# Patient Record
Sex: Female | Born: 1975 | Race: White | Hispanic: No | Marital: Married | Smoking: Former smoker
Health system: Southern US, Community
[De-identification: ages and names within clinical notes are randomized; demographics above are authoritative.]

## PROBLEM LIST (undated history)

## (undated) ENCOUNTER — Inpatient Hospital Stay (HOSPITAL_COMMUNITY): Payer: BC Managed Care – PPO

## (undated) DIAGNOSIS — T7840XA Allergy, unspecified, initial encounter: Secondary | ICD-10-CM

## (undated) DIAGNOSIS — G709 Myoneural disorder, unspecified: Secondary | ICD-10-CM

## (undated) DIAGNOSIS — E119 Type 2 diabetes mellitus without complications: Secondary | ICD-10-CM

## (undated) DIAGNOSIS — O0990 Supervision of high risk pregnancy, unspecified, unspecified trimester: Secondary | ICD-10-CM

## (undated) DIAGNOSIS — D693 Immune thrombocytopenic purpura: Principal | ICD-10-CM

## (undated) HISTORY — PX: WISDOM TOOTH EXTRACTION: SHX21

## (undated) HISTORY — DX: Supervision of high risk pregnancy, unspecified, unspecified trimester: O09.90

## (undated) HISTORY — DX: Allergy, unspecified, initial encounter: T78.40XA

## (undated) HISTORY — DX: Immune thrombocytopenic purpura: D69.3

## (undated) HISTORY — DX: Type 2 diabetes mellitus without complications: E11.9

---

## 2005-10-08 ENCOUNTER — Encounter: Admission: RE | Admit: 2005-10-08 | Discharge: 2005-10-08 | Payer: Self-pay | Admitting: *Deleted

## 2005-12-24 ENCOUNTER — Inpatient Hospital Stay (HOSPITAL_COMMUNITY): Admission: RE | Admit: 2005-12-24 | Discharge: 2005-12-26 | Payer: Self-pay | Admitting: *Deleted

## 2008-11-17 ENCOUNTER — Encounter: Admission: RE | Admit: 2008-11-17 | Discharge: 2008-11-17 | Payer: Self-pay | Admitting: Otolaryngology

## 2009-05-19 ENCOUNTER — Ambulatory Visit: Payer: Self-pay | Admitting: Oncology

## 2009-05-25 LAB — FOLATE: Folate: >20 ng/mL

## 2009-05-25 LAB — CBC WITH DIFFERENTIAL/PLATELET
BASO%: 0.3 % (ref 0.0–2.0)
Basophils Absolute: 0 10*3/uL (ref 0.0–0.1)
HGB: 14.5 g/dL (ref 11.6–15.9)
MCH: 33.3 pg (ref 25.1–34.0)
MCV: 95.8 fL (ref 79.5–101.0)
NEUT#: 3.4 10*3/uL (ref 1.5–6.5)
Platelets: 130 10*3/uL — ABNORMAL LOW (ref 145–400)
RBC: 4.34 10*6/uL (ref 3.70–5.45)

## 2009-05-25 LAB — MORPHOLOGY: PLT EST: DECREASED

## 2009-05-25 LAB — CHCC SMEAR

## 2009-05-25 LAB — VITAMIN B12: Vitamin B-12: 323 pg/mL (ref 211–911)

## 2009-07-24 ENCOUNTER — Encounter: Admission: RE | Admit: 2009-07-24 | Discharge: 2009-07-24 | Payer: Self-pay | Admitting: Family Medicine

## 2009-11-21 ENCOUNTER — Ambulatory Visit: Payer: Self-pay | Admitting: Oncology

## 2009-11-23 LAB — MORPHOLOGY: PLT EST: DECREASED

## 2009-11-23 LAB — CBC WITH DIFFERENTIAL/PLATELET
BASO%: 0.4 % (ref 0.0–2.0)
EOS%: 1.9 % (ref 0.0–7.0)
Eosinophils Absolute: 0.1 10*3/uL (ref 0.0–0.5)
HCT: 37.9 % (ref 34.8–46.6)
MCH: 33.3 pg (ref 25.1–34.0)
MCV: 94.9 fL (ref 79.5–101.0)
MONO%: 9.7 % (ref 0.0–14.0)
Platelets: 134 10*3/uL — ABNORMAL LOW (ref 145–400)
RDW: 12.6 % (ref 11.2–14.5)
WBC: 5.9 10*3/uL (ref 3.9–10.3)
lymph#: 2.1 10*3/uL (ref 0.9–3.3)

## 2009-11-23 LAB — CHCC SMEAR

## 2010-05-18 ENCOUNTER — Ambulatory Visit: Payer: Self-pay | Admitting: Oncology

## 2010-05-22 LAB — CBC WITH DIFFERENTIAL/PLATELET
BASO%: 0.7 % (ref 0.0–2.0)
Basophils Absolute: 0 10e3/uL (ref 0.0–0.1)
EOS%: 1.3 % (ref 0.0–7.0)
Eosinophils Absolute: 0.1 10e3/uL (ref 0.0–0.5)
HCT: 39.9 % (ref 34.8–46.6)
HGB: 13.7 g/dL (ref 11.6–15.9)
LYMPH%: 27.4 % (ref 14.0–49.7)
MCH: 32.7 pg (ref 25.1–34.0)
MCHC: 34.3 g/dL (ref 31.5–36.0)
MCV: 95.2 fL (ref 79.5–101.0)
MONO#: 0.6 10e3/uL (ref 0.1–0.9)
MONO%: 9.3 % (ref 0.0–14.0)
NEUT#: 3.7 10e3/uL (ref 1.5–6.5)
NEUT%: 61.3 % (ref 38.4–76.8)
Platelets: 128 10e3/uL — ABNORMAL LOW (ref 145–400)
RBC: 4.19 10e6/uL (ref 3.70–5.45)
RDW: 12.7 % (ref 11.2–14.5)
WBC: 6 10e3/uL (ref 3.9–10.3)
lymph#: 1.7 10e3/uL (ref 0.9–3.3)

## 2010-05-22 LAB — COMPREHENSIVE METABOLIC PANEL WITH GFR
ALT: 17 U/L (ref 0–35)
AST: 16 U/L (ref 0–37)
Albumin: 4.6 g/dL (ref 3.5–5.2)
Alkaline Phosphatase: 80 U/L (ref 39–117)
BUN: 9 mg/dL (ref 6–23)
CO2: 24 meq/L (ref 19–32)
Calcium: 8.8 mg/dL (ref 8.4–10.5)
Chloride: 102 meq/L (ref 96–112)
Creatinine, Ser: 0.72 mg/dL (ref 0.40–1.20)
Glucose, Bld: 99 mg/dL (ref 70–99)
Potassium: 3.8 meq/L (ref 3.5–5.3)
Sodium: 139 meq/L (ref 135–145)
Total Bilirubin: 1.1 mg/dL (ref 0.3–1.2)
Total Protein: 7.1 g/dL (ref 6.0–8.3)

## 2010-06-21 HISTORY — PX: OTHER SURGICAL HISTORY: SHX169

## 2010-07-03 ENCOUNTER — Ambulatory Visit (HOSPITAL_COMMUNITY)
Admission: RE | Admit: 2010-07-03 | Discharge: 2010-07-03 | Disposition: A | Discharge status: Home / Self Care | Payer: BC Managed Care – PPO | Source: Ambulatory Visit | Attending: Obstetrics and Gynecology | Admitting: Obstetrics and Gynecology

## 2010-07-03 DIAGNOSIS — T8339XA Other mechanical complication of intrauterine contraceptive device, initial encounter: Secondary | ICD-10-CM | POA: Insufficient documentation

## 2010-07-03 LAB — CBC
HCT: 41.5 % (ref 36.0–46.0)
MCH: 32.3 pg (ref 26.0–34.0)
MCHC: 33.7 g/dL (ref 30.0–36.0)
MCV: 95.6 fL (ref 78.0–100.0)
Platelets: 142 10*3/uL — ABNORMAL LOW (ref 150–400)
RBC: 4.34 MIL/uL (ref 3.87–5.11)
RDW: 12.7 % (ref 11.5–15.5)

## 2010-07-12 NOTE — Op Note (Signed)
  [name], [name]              ACCOUNT NO.:  [id number]  MEDICAL RECORD NO.:  [id number]           PATIENT TYPE:  O  LOCATION:  [facility]                          [facility]  PHYSICIAN:  Lenoard Aden, M.D.DATE OF BIRTH:  Aug 26, 1975  DATE OF PROCEDURE:  07/03/2010 DATE OF DISCHARGE:                              OPERATIVE REPORT   PREOPERATIVE DIAGNOSIS:  Retained intrauterine (contraceptive) device.  POSTOPERATIVE DIAGNOSIS:  Retained intrauterine (contraceptive) device plus embedded intrauterine (contraceptive) device.  PROCEDURE:  Diagnostic hysteroscopy, removal of embedded intrauterine (contraceptive) device.  SURGEON:  Lenoard Aden, MD  ASSISTANT:  None.  ANESTHESIA:  General and local.  ESTIMATED BLOOD LOSS:  Less than 50 mL.  FLUID DEFICIT:  50 mL.  SPECIMEN:  IUD.  The patient to recovery in good condition.  BRIEF OPERATIVE NOTE:  After being apprised of risks of anesthesia, infection, bleeding, injury to abdominal organs, need for repair, delayed versus immediate complications including bowel and bladder injury, possible need for repair, the patient was brought to the operating room where she was administered a general anesthetic without complications, prepped and draped in usual sterile fashion. Catheterized till bladder was empty.  After achieving adequate anesthesia, dilute Marcaine solution was placed for a standard paracervical block, 20 mL total.  Cervix was easily dilated up #21 Pratt dilator.  Hysteroscope was placed.  Visualization reveals an IUD which was imbedded partially in the fundus, this was dislodged using graspers and removed intact.  Good hemostasis was noted. Revisualization of the cavity reveals complete removal of the IUD with no evidence of intrauterine bleeding.  The cavity is intact.  The patient tolerated the procedure well.  IUD appears intact upon removal. The patient was awakened and transferred to recovery in good  condition. Fluid deficit 50 mL.     Lenoard Aden, M.D.     RJT/MEDQ  D:  07/03/2010  T:  07/04/2010  Job:  [id number]  Electronically Signed by Olivia Mackie M.D. on 07/12/2010 01:31:13 PM

## 2010-07-12 NOTE — H&P (Signed)
  NAMEMARJO, GROSVENOR              ACCOUNT NO.:  [id number]  MEDICAL RECORD NO.:  [id number]           PATIENT TYPE:  O  LOCATION:  [facility]                           [facility]  PHYSICIAN:  Lenoard Aden, M.D.DATE OF BIRTH:  [birth year]  DATE OF ADMISSION:  07/03/2010 DATE OF DISCHARGE:                             HISTORY & PHYSICAL   CHIEF COMPLAINT:  Retained IUD with inability to remove under ultrasound guidance in the office.  HISTORY OF PRESENT ILLNESS:  The patient is a 35 year old Caucasian female G2, P1 with a history of cryosurgery and subsequent history of placement of an IUD in 2009 by Dr. Earlene Plater who presents now for IUD removal.  IUD had to be removed in the office but string was not visualized.  The patient returned to the office after a paracervical block with cervical dilatation and inability to remove that under ultrasound guidance.  She now presents for removal via hysteroscopy in the operating room.  She has a past surgical history remarkable for cryosurgery.  She has a history of one AB and one C-section.  FAMILY HISTORY:  Remarkable for colon cancer, liver cancer, breast cancer, and heart disease.  SOCIAL HISTORY:  She is a nonsmoker and nondrinker.  She denies domestic violence.  PHYSICAL EXAMINATION:  GENERAL:  She is a well-developed and well- nourished white female. VITAL SIGNS:  Height of 63 inches and weight of 160 pounds. HEENT:  Normal. NECK:  Supple.  Full range of motion. LUNGS:  Clear. ABDOMEN:  Soft and nontender. PELVIC:  Uterus to be mid-positioned with no adnexal masses. EXTREMITIES:  No cords. NEUROLOGIC: Nonfocal. SKIN: Intact.  IMPRESSION:  Retained intrauterine device with nonvisualization of intrauterine device string and inability to remove in an office setting due to cervical stenosis.  PLAN:  Proceed with diagnostic hysteroscopy and IUD removal.  Risks of anesthesia, infection, bleeding, injury to abdominal organs, need  for repair was discussed.  Delayed versus immediate complications include bowel and bladder injury, and possible need to repair were noted.  The patient acknowledges, will proceed.     Lenoard Aden, M.D.     RJT/MEDQ  D:  07/02/2010  T:  07/02/2010  Job:  [id number]  Electronically Signed by Olivia Mackie M.D. on 07/12/2010 01:31:10 PM

## 2010-09-07 NOTE — Op Note (Signed)
[name], MOFFA              ACCOUNT NO.:  [id number]   MEDICAL RECORD NO.:  [id number]          PATIENT TYPE:  INP   LOCATION:  NA                            [facility]   PHYSICIAN:  [place] B. Earlene Plater, M.D.  DATE OF BIRTH:  1975-07-03   DATE OF PROCEDURE:  12/24/2005  DATE OF DISCHARGE:                                 OPERATIVE REPORT   PREOPERATIVE DIAGNOSES:  1. Intrauterine pregnancy at 39 weeks.  2. Breech presentation.   POSTOPERATIVE DIAGNOSES:  1. Intrauterine pregnancy at 39 weeks.  2. Breech presentation.   PROCEDURE:  Primary low transverse cesarean section.   SURGEON:  Chester Holstein. Earlene Plater, M.D.   ASSISTANT:  Marlinda Mike, Certified Nurse Midwife   ANESTHESIA:  Spinal.   SPECIMENS:  None.   BLOOD LOSS:  700.   COMPLICATIONS:  None.   FINDINGS:  Homero Fellers breech female, 7 pounds 8 ounces, Apgars 9 and 9.  Cord pH  7.34.   INDICATIONS:  Patient with above history, declines trial of ECV.  Also, the  patient had a very narrow pelvic outlet and we discussed, throughout her  prenatal care, that C-section would probably be the best idea, regardless,  as I am concerned she would be at increased risk for severe perineal  laceration, if vaginal delivery would occur.  The patient was advised of the  risks of surgery, including infection, bleeding, damage to surrounding  organs.   DESCRIPTION OF PROCEDURE:  The patient was taken to the operating room and  spinal anesthesia obtained.  She was prepped and draped in standard fashion  and Foley catheter inserted into the bladder.   A Pfannenstiel incision was made, carried sharply to the fascia.  The fascia  was divided sharply.  The underlying rectus muscles were dissected off  sharply.  The posterior sheath and peritoneum were elevated and entered  sharply, bladder blade inserted, bladder flap created with sharp and blunt  technique.   Uterine incision made in low transverse fashion with a knife.  Clear fluid  at  amniotomy.  The incision was extended laterally with bandage scissors.  Frank breech presentation was encountered.  Initial attempt at fundal  pressure and traction on the sacrum to deliver the breech directly was  unsuccessful.  The uterine incision was essentially completely involving the  entire low transverse segment.  Therefore, extension, in my mind, would make  no improvement.  The breech was sacrum posterior.  The legs were delivered  by flexion at the knees and traction at the ankles.  Attempt was made to  rotate to sacrum anterior, once the legs were delivered.  However, this was  unsuccessful.  Therefore, traction continued to the level of the thorax.  Each arm was delivered via flexion of the elbow.  The vertex was delivered  via flexion of the neck.  Nose and mouth were suctioned with the bulb, cord  clamped and cut and infant handed off to awaiting pediatricians, cord pH  obtained, 1 g of Ancef given at cord clamp.   Placenta was removed via uterine massage, uterus exteriorized and cleared of  all clots and debris.  It was free of extension.  Uterine incision was  closed in a running, locked fashion with 0 chromic.  Second, imbricating  layer placed with same suture.  Hemostasis was obtained.   Uterus, tubes and ovaries were inspected and were normal.   Uterus was returned to the abdomen and pelvis irrigated.  Uterine incision  and bladder flap were hemostatic, as was the subfascial space.   Fascia was closed with a running stitch of 0 Vicryl.  Subcutaneous tissue  was irrigated and made hemostatic with the Bovie.  The skin was closed with  staples.   The patient tolerated the procedure well.  There were no complications.  She  was taken to the recovery room, awake, alert and in stable condition.  All  counts were correct per the operating room staff.      Gerri Spore B. Earlene Plater, M.D.  Electronically Signed     WBD/MEDQ  D:  12/24/2005  T:  12/24/2005  Job:  [id number]

## 2010-09-07 NOTE — Discharge Summary (Signed)
NAMEMARIROSE, DEVENEY              ACCOUNT NO.:  [id number]   MEDICAL RECORD NO.:  [id number]          PATIENT TYPE:  INP   LOCATION:  9110                          [facility]   PHYSICIAN:  Gerri Spore B. Earlene Plater, M.D.  DATE OF BIRTH:  [birth year]   DATE OF ADMISSION:  12/24/2005  DATE OF DISCHARGE:  12/26/2005                                 DISCHARGE SUMMARY   ADMISSION DIAGNOSES:  1. Thirty-six week intrauterine pregnancy.  2. Breech presentation.   DISCHARGE DIAGNOSES:  1. Thirty-six week intrauterine pregnancy.  2. Breech presentation.   PROCEDURE:  Primary low transverse c-section.   HISTORY OF PRESENT ILLNESS:  A 35 year old white female, gravida 2, para 0,  A1, 39 weeks, breech presentation having declined attempted external  cephalic version for primary C-section.   Patient was delivered by a primary low transverse C-section, frank breech  female 7 pounds, 8 ounces, Apgars 9 and 9, cord pH 7.34.   Postoperatively, patient rapidly regained strength, ability to ambulate,  void and tolerate a regular diet.  She was discharged on the second  postoperative day in satisfactory condition.   DISCHARGE MEDICATIONS:  Darvocet 1-2 p.o. q.4-6 h. p.r.n. pain.   DISCHARGE INSTRUCTIONS:  Per booklet.  Follow up, [facility] in 6  weeks.   DISPOSITION:  Discharge satisfactory.      Gerri Spore B. Earlene Plater, M.D.  Electronically Signed     WBD/MEDQ  D:  01/27/2006  T:  01/28/2006  Job:  [id number]

## 2010-11-20 ENCOUNTER — Other Ambulatory Visit: Payer: Self-pay | Admitting: Oncology

## 2010-11-20 ENCOUNTER — Encounter (HOSPITAL_BASED_OUTPATIENT_CLINIC_OR_DEPARTMENT_OTHER): Payer: BC Managed Care – PPO | Admitting: Oncology

## 2010-11-20 DIAGNOSIS — D696 Thrombocytopenia, unspecified: Secondary | ICD-10-CM

## 2010-11-20 LAB — CBC WITH DIFFERENTIAL/PLATELET
Basophils Absolute: 0.1 10*3/uL (ref 0.0–0.1)
EOS%: 1.5 % (ref 0.0–7.0)
LYMPH%: 30.3 % (ref 14.0–49.7)
MCH: 33.8 pg (ref 25.1–34.0)
MCV: 96.1 fL (ref 79.5–101.0)
MONO%: 8.9 % (ref 0.0–14.0)
NEUT%: 58.4 % (ref 38.4–76.8)
RBC: 4.22 10*6/uL (ref 3.70–5.45)
WBC: 7.8 10*3/uL (ref 3.9–10.3)

## 2011-03-03 ENCOUNTER — Telehealth: Payer: Self-pay | Admitting: Oncology

## 2011-03-03 NOTE — Telephone Encounter (Signed)
lmonvm advising the pt that her appt d/t has been changed since the pa has left the practice and she will be seeing dr ha instead. Lm with the d/t's of the new apts in feb. Advised the pt to pick up her appt calendar for the rest of her appts

## 2011-05-06 ENCOUNTER — Encounter (HOSPITAL_COMMUNITY): Payer: Self-pay | Admitting: *Deleted

## 2011-05-06 NOTE — H&P (Signed)
NAMELAQUANDA, BICK              ACCOUNT NO.:  [id number]  MEDICAL RECORD NO.:  [id number]  LOCATION:  [facility]                         [facility]  PHYSICIAN:  Lenoard Aden, M.D.DATE OF BIRTH:  06-Jan-1976  DATE OF ADMISSION:  05/06/2011 DATE OF DISCHARGE:                             HISTORY & PHYSICAL   CHIEF COMPLAINT:  Missed AB.  HISTORY OF PRESENT ILLNESS:  This is a 36 year old white female, G2, P1, who presents status post ultrasound 1 week ago, consistent with a 7-week missed AB with an empty gestational sac noted, who presents now for D and E.  Medical options were discussed, the patient declined.  MEDICATIONS:  Prenatal vitamins.  She is a nonsmoker, nondrinker.  She denies domestic or physical violence.  PAST SURGICAL HISTORY:  C-section x2 and a TAB in 1993.  Also history of cryosurgery, hysteroscopy with IUD removal.  PHYSICAL EXAMINATION:  GENERAL:  She is a well-developed, well- nourished, white female, in no acute distress. HEENT:  Normal. NECK:  Supple.  Full range of motion. LUNGS:  Clear. HEART:  Regular rhythm. ABDOMEN:  Soft.  Nontender. PELVIC:  A 6-8 weeks' size uterus.  No adnexal masses. EXTREMITIES:  No cords. NEUROLOGIC:  Nonfocal. SKIN:  Intact.  IMPRESSION: 1. Missed abortion. 2. Previous cesarean section x2. 3. Rh positive.  PLAN:  Proceed with suction D and E.  Risks of anesthesia, infection, bleeding, injury to abdominal organs, need for repair were discussed. Delayed versus immediate complications to include bowel and bladder injury were noted.  The patient was offered medical options and declined.  We will proceed with D and E as noted.     Lenoard Aden, M.D.     RJT/MEDQ  D:  05/06/2011  T:  05/06/2011  Job:  [id number]

## 2011-05-07 ENCOUNTER — Other Ambulatory Visit: Payer: Self-pay | Admitting: Obstetrics and Gynecology

## 2011-05-07 ENCOUNTER — Ambulatory Visit (HOSPITAL_COMMUNITY)
Admission: RE | Admit: 2011-05-07 | Discharge: 2011-05-07 | Disposition: A | Discharge status: Home / Self Care | Payer: BC Managed Care – PPO | Source: Ambulatory Visit | Attending: Obstetrics and Gynecology | Admitting: Obstetrics and Gynecology

## 2011-05-07 ENCOUNTER — Ambulatory Visit (HOSPITAL_COMMUNITY): Payer: BC Managed Care – PPO | Admitting: Anesthesiology

## 2011-05-07 ENCOUNTER — Encounter (HOSPITAL_COMMUNITY)
Admission: RE | Disposition: A | Discharge status: Home / Self Care | Payer: Self-pay | Source: Ambulatory Visit | Attending: Obstetrics and Gynecology

## 2011-05-07 ENCOUNTER — Encounter (HOSPITAL_COMMUNITY): Payer: Self-pay | Admitting: Anesthesiology

## 2011-05-07 DIAGNOSIS — O021 Missed abortion: Secondary | ICD-10-CM

## 2011-05-07 HISTORY — PX: DILATION AND EVACUATION: SHX1459

## 2011-05-07 LAB — CBC
HCT: 36.8 % (ref 36.0–46.0)
Hemoglobin: 12.7 g/dL (ref 12.0–15.0)
MCH: 32.6 pg (ref 26.0–34.0)
MCHC: 34.5 g/dL (ref 30.0–36.0)
MCV: 94.6 fL (ref 78.0–100.0)
Platelets: 106 K/uL — ABNORMAL LOW (ref 150–400)
RBC: 3.89 MIL/uL (ref 3.87–5.11)
RDW: 12.4 % (ref 11.5–15.5)
WBC: 5.8 K/uL (ref 4.0–10.5)

## 2011-05-07 SURGERY — DILATION AND EVACUATION, UTERUS
Anesthesia: Monitor Anesthesia Care | Site: Vagina | Wound class: Clean Contaminated

## 2011-05-07 MED ORDER — ONDANSETRON HCL 4 MG/2ML IJ SOLN
INTRAMUSCULAR | Status: DC | PRN
  Administered 2011-05-07: 4 mg via INTRAVENOUS

## 2011-05-07 MED ORDER — PROPOFOL 10 MG/ML IV EMUL
INTRAVENOUS | Status: DC | PRN
  Administered 2011-05-07: 100 ug/kg/min via INTRAVENOUS

## 2011-05-07 MED ORDER — MIDAZOLAM HCL 5 MG/5ML IJ SOLN
INTRAMUSCULAR | Status: DC | PRN
  Administered 2011-05-07: 2 mg via INTRAVENOUS

## 2011-05-07 MED ORDER — DEXAMETHASONE SODIUM PHOSPHATE 4 MG/ML IJ SOLN
INTRAMUSCULAR | Status: DC | PRN
  Administered 2011-05-07: 10 mg via INTRAVENOUS

## 2011-05-07 MED ORDER — MIDAZOLAM HCL 2 MG/2ML IJ SOLN
INTRAMUSCULAR | Status: AC
  Filled 2011-05-07: qty 2

## 2011-05-07 MED ORDER — FENTANYL CITRATE 0.05 MG/ML IJ SOLN
INTRAMUSCULAR | Status: AC
  Filled 2011-05-07: qty 2

## 2011-05-07 MED ORDER — FENTANYL CITRATE 0.05 MG/ML IJ SOLN
INTRAMUSCULAR | Status: DC | PRN
  Administered 2011-05-07: 50 ug via INTRAVENOUS

## 2011-05-07 MED ORDER — LACTATED RINGERS IV SOLN
INTRAVENOUS | Status: DC
  Administered 2011-05-07: 10:00:00 via INTRAVENOUS

## 2011-05-07 MED ORDER — MEPERIDINE HCL 25 MG/ML IJ SOLN: 6.2500 mg | INTRAMUSCULAR | Status: DC | PRN

## 2011-05-07 MED ORDER — OXYCODONE-ACETAMINOPHEN 5-325 MG PO TABS: 1.0000 | ORAL_TABLET | ORAL | Status: AC | PRN

## 2011-05-07 MED ORDER — PROPOFOL 10 MG/ML IV EMUL
INTRAVENOUS | Status: DC | PRN
  Administered 2011-05-07 (×3): 50 mg via INTRAVENOUS

## 2011-05-07 MED ORDER — METOCLOPRAMIDE HCL 5 MG/ML IJ SOLN: 10.0000 mg | Freq: Once | INTRAMUSCULAR | Status: DC | PRN

## 2011-05-07 MED ORDER — KETOROLAC TROMETHAMINE 30 MG/ML IJ SOLN
INTRAMUSCULAR | Status: DC | PRN
  Administered 2011-05-07: 30 mg via INTRAVENOUS

## 2011-05-07 MED ORDER — FENTANYL CITRATE 0.05 MG/ML IJ SOLN: 25.0000 ug | INTRAMUSCULAR | Status: DC | PRN

## 2011-05-07 SURGICAL SUPPLY — 20 items
CATH ROBINSON RED A/P 16FR (CATHETERS) ×2 IMPLANT
CLOTH BEACON ORANGE TIMEOUT ST (SAFETY) ×2 IMPLANT
DECANTER SPIKE VIAL GLASS SM (MISCELLANEOUS) ×2 IMPLANT
GLOVE BIO SURGEON STRL SZ7.5 (GLOVE) ×4 IMPLANT
GOWN PREVENTION PLUS LG XLONG (DISPOSABLE) ×2 IMPLANT
GOWN PREVENTION PLUS XLARGE (GOWN DISPOSABLE) ×2 IMPLANT
KIT BERKELEY 1ST TRIMESTER 3/8 (MISCELLANEOUS) ×2 IMPLANT
NEEDLE SPNL 22GX3.5 QUINCKE BK (NEEDLE) ×2 IMPLANT
NS IRRIG 1000ML POUR BTL (IV SOLUTION) ×2 IMPLANT
PACK VAGINAL MINOR WOMEN LF (CUSTOM PROCEDURE TRAY) ×2 IMPLANT
PAD PREP 24X48 CUFFED NSTRL (MISCELLANEOUS) ×2 IMPLANT
SET BERKELEY SUCTION TUBING (SUCTIONS) ×2 IMPLANT
SYR CONTROL 10ML LL (SYRINGE) ×2 IMPLANT
TOWEL OR 17X24 6PK STRL BLUE (TOWEL DISPOSABLE) ×4 IMPLANT
VACURETTE 10 RIGID CVD (CANNULA) IMPLANT
VACURETTE 7MM CVD STRL WRAP (CANNULA) IMPLANT
VACURETTE 7MM STRAIGHT (CANNULA) IMPLANT
VACURETTE 8 RIGID CVD (CANNULA) IMPLANT
VACURETTE 9 RIGID CVD (CANNULA) IMPLANT
VACURRETTE 7MM STRAIGHT (CANNULA)

## 2011-05-07 NOTE — Transfer of Care (Signed)
Immediate Anesthesia Transfer of Care Note  Patient: [name]  Procedure(s) Performed:  DILATATION AND EVACUATION  Patient Location: PACU  Anesthesia Type: MAC  Level of Consciousness: awake and alert   Airway & Oxygen Therapy: Patient connected to nasal cannula oxygen  Post-op Assessment: Report given to PACU RN  Post vital signs: Reviewed and stable Filed Vitals:   05/07/11 1111  BP: 101/71  Pulse:   Temp: 36.8 C  Resp: 14    Complications: No apparent anesthesia complications

## 2011-05-07 NOTE — Op Note (Signed)
05/07/2011  11:07 AM  PATIENT:  [name]  36 y.o. female  PRE-OPERATIVE DIAGNOSIS:  Missed Abortion at [redacted] weeks gestation  POST-OPERATIVE DIAGNOSIS:  Missed Abortion at [redacted] weeks gestation  PROCEDURE:  Procedure(s): DILATATION AND EVACUATION  SURGEON:  Surgeon(s): Lenoard Aden, MD  ASSISTANTS: none   ANESTHESIA:   local and IV sedation  ESTIMATED BLOOD LOSS: * No blood loss amount entered *   DRAINS: none   LOCAL MEDICATIONS USED:  MARCAINE 20CC  SPECIMEN:  Source of Specimen:  POC  DISPOSITION OF SPECIMEN:  PATHOLOGY  COUNTS:  YES  DICTATION #: [id number]  PLAN OF CARE: DC home  PATIENT DISPOSITION:  PACU - hemodynamically stable.

## 2011-05-07 NOTE — Anesthesia Preprocedure Evaluation (Signed)
Anesthesia Evaluation  Patient identified by MRN, date of birth, ID band Patient awake    Reviewed: Allergy & Precautions, H&P , NPO status , Patient's Chart, lab work & pertinent test results  Airway Mallampati: III TM Distance: >3 FB Neck ROM: full    Dental No notable dental hx. (+) Teeth Intact   Pulmonary neg pulmonary ROS,  clear to auscultation  Pulmonary exam normal       Cardiovascular neg cardio ROS regular Normal    Neuro/Psych Negative Neurological ROS  Negative Psych ROS   GI/Hepatic negative GI ROS, Neg liver ROS,   Endo/Other  Negative Endocrine ROS  Renal/GU negative Renal ROS  Genitourinary negative   Musculoskeletal   Abdominal Normal abdominal exam  (+)   Peds  Hematology negative hematology ROS (+)   Anesthesia Other Findings   Reproductive/Obstetrics (+) Pregnancy                           Anesthesia Physical Anesthesia Plan  ASA: II  Anesthesia Plan: MAC   Post-op Pain Management:    Induction:   Airway Management Planned:   Additional Equipment:   Intra-op Plan:   Post-operative Plan:   Informed Consent: I have reviewed the patients History and Physical, chart, labs and discussed the procedure including the risks, benefits and alternatives for the proposed anesthesia with the patient or authorized representative who has indicated his/her understanding and acceptance.   Dental Advisory Given  Plan Discussed with: Anesthesiologist, CRNA and Surgeon  Anesthesia Plan Comments:         Anesthesia Quick Evaluation

## 2011-05-07 NOTE — Anesthesia Procedure Notes (Signed)
Procedure Name: MAC Date/Time: 05/07/2011 10:51 AM Performed by: Jamas Jaquay MARIE Pre-anesthesia Checklist: Patient identified, Timeout performed, Emergency Drugs available, Suction available and Patient being monitored Patient Re-evaluated:Patient Re-evaluated prior to induction

## 2011-05-07 NOTE — Progress Notes (Signed)
Patient ID: [name], female   DOB: 06-12-1975, 36 y.o.   MRN: [id number] Patient seen and examined. Consent witnessed and signed. No changes noted. Update completed.

## 2011-05-08 NOTE — Op Note (Signed)
[name], [name]              ACCOUNT NO.:  [id number]  MEDICAL RECORD NO.:  [id number]  LOCATION:  [facility]                          [facility]  PHYSICIAN:  Lenoard Aden, M.D.DATE OF BIRTH:  March 17, 1976  DATE OF PROCEDURE: DATE OF DISCHARGE:  05/07/2011                              OPERATIVE REPORT   DESCRIPTION OF PROCEDURE:  After being apprised of risks of anesthesia, infection, bleeding, injury to abdominal organs, need for repair, delayed versus immediate complications to include bowel and bladder injury, possible need for repair, and uterine perforation, the patient brought to the operating room, where she was administered IV sedation without difficulty, prepped and draped in usual sterile fashion.  Feet were placed in Yellofin stirrups.  SCDs are intact.  After achieving adequate anesthesia, dilute Marcaine solution was placed and a paracervical block 20 mL total.  Uterus and cervix was easily dilated up to 23 Pratt dilator, 7 mm suction curette placed.  Aspiration and products of conception was performed without difficulty.  Tissue was aspirated sent to pathology for permanent section.  Repeat suction curettage in a 4-quadrant method reveals cavity to be empty.  Good hemostasis was noted.  All instruments removed.  The patient tolerated procedure well, transferred to recovery in good condition.     Lenoard Aden, M.D.     RJT/MEDQ  D:  05/07/2011  T:  05/08/2011  Job:  [id number]

## 2011-05-09 ENCOUNTER — Encounter (HOSPITAL_COMMUNITY): Payer: Self-pay | Admitting: Obstetrics and Gynecology

## 2011-05-09 NOTE — Anesthesia Postprocedure Evaluation (Signed)
  Anesthesia Post-op Note  Patient: [name]  Procedure(s) Performed:  DILATATION AND EVACUATION  Patient Location: PACU  Anesthesia Type: MAC  Level of Consciousness: awake, alert  and oriented  Airway and Oxygen Therapy: Patient Spontanous Breathing  Post-op Pain: none  Post-op Assessment: Post-op Vital signs reviewed, Patient's Cardiovascular Status Stable, Respiratory Function Stable, Patent Airway, No signs of Nausea or vomiting and Pain level controlled  Post-op Vital Signs: Reviewed and stable  Complications: No apparent anesthesia complications

## 2011-05-10 ENCOUNTER — Encounter: Payer: Self-pay | Admitting: *Deleted

## 2011-05-27 ENCOUNTER — Ambulatory Visit: Payer: BC Managed Care – PPO | Admitting: Oncology

## 2011-05-27 ENCOUNTER — Other Ambulatory Visit: Payer: BC Managed Care – PPO | Admitting: Lab

## 2011-10-07 ENCOUNTER — Inpatient Hospital Stay (HOSPITAL_COMMUNITY): Admission: AD | Admit: 2011-10-07 | Payer: Self-pay | Source: Ambulatory Visit | Admitting: Obstetrics and Gynecology

## 2011-11-20 ENCOUNTER — Other Ambulatory Visit: Payer: BC Managed Care – PPO | Admitting: Lab

## 2011-12-29 NOTE — Progress Notes (Signed)
No show.  Reschedule as needed.  

## 2012-02-12 ENCOUNTER — Other Ambulatory Visit: Payer: Self-pay | Admitting: Obstetrics and Gynecology

## 2012-02-13 ENCOUNTER — Encounter (HOSPITAL_COMMUNITY): Payer: Self-pay | Admitting: Pharmacist

## 2012-02-13 ENCOUNTER — Encounter (HOSPITAL_COMMUNITY): Payer: Self-pay | Admitting: *Deleted

## 2012-02-13 NOTE — H&P (Signed)
[name], [name]              ACCOUNT NO.:  [id number]  MEDICAL RECORD NO.:  [id number]  LOCATION:  [facility]                         [facility]  PHYSICIAN:  Lenoard Aden, M.D.DATE OF BIRTH:  Mar 03, 1976  DATE OF ADMISSION:  02/12/2012 DATE OF DISCHARGE:                             HISTORY & PHYSICAL   CHIEF COMPLAINT:  Missed AB.  A 36 year old white female G3, P1 with her recurrent pregnancy loss.  MEDICATIONS:  Prenatal vitamins.  Questionable allergies to penicillin.  She is a nonsmoker, nondrinker.  She denies domestic or physical violence.  Family history of breast cancer, MI, liver and colon cancer.  Previous surgery remarkable for SAB x1, C-section x1,  cryosurgery x1, hysteroscopy with IUD removal x1.  PHYSICAL EXAMINATION:  GENERAL:  She is a well-developed, well- nourished, white female, in no acute distress. HEENT:  Normal. NECK:  Supple.  Full range of motion. LUNGS:  Clear. HEART:  Regular rhythm. ABDOMEN:  Soft, nontender. PELVIC:  Reveals a 6-8 weeks uterus, and no adnexal masses.  IMPRESSION:  Missed abortion for suction dilation and evacuation .  PLAN:  Proceed with suction D and E.  Risks of anesthesia, infection, bleeding, injury to abdominal organs, need for repair was discussed. Delayed versus immediate complications to include bowel and bladder injury noted.  The patient acknowledges and will proceed.     Lenoard Aden, M.D.    RJT/MEDQ  D:  02/13/2012  T:  02/13/2012  Job:  [id number]

## 2012-02-14 ENCOUNTER — Encounter (HOSPITAL_COMMUNITY)
Admission: RE | Disposition: A | Discharge status: Home / Self Care | Payer: Self-pay | Source: Ambulatory Visit | Attending: Obstetrics and Gynecology

## 2012-02-14 ENCOUNTER — Ambulatory Visit (HOSPITAL_COMMUNITY): Payer: BC Managed Care – PPO | Admitting: Anesthesiology

## 2012-02-14 ENCOUNTER — Ambulatory Visit (HOSPITAL_COMMUNITY)
Admission: RE | Admit: 2012-02-14 | Discharge: 2012-02-14 | Disposition: A | Discharge status: Home / Self Care | Payer: BC Managed Care – PPO | Source: Ambulatory Visit | Attending: Obstetrics and Gynecology | Admitting: Obstetrics and Gynecology

## 2012-02-14 ENCOUNTER — Encounter (HOSPITAL_COMMUNITY): Payer: Self-pay | Admitting: Anesthesiology

## 2012-02-14 DIAGNOSIS — O021 Missed abortion: Secondary | ICD-10-CM | POA: Insufficient documentation

## 2012-02-14 HISTORY — PX: DILATION AND EVACUATION: SHX1459

## 2012-02-14 LAB — CBC
HCT: 39.2 % (ref 36.0–46.0)
Platelets: 119 10*3/uL — ABNORMAL LOW (ref 150–400)
RDW: 12.3 % (ref 11.5–15.5)
WBC: 7.1 10*3/uL (ref 4.0–10.5)

## 2012-02-14 SURGERY — DILATION AND EVACUATION, UTERUS
Anesthesia: Monitor Anesthesia Care | Site: Uterus | Wound class: Clean Contaminated

## 2012-02-14 MED ORDER — FENTANYL CITRATE 0.05 MG/ML IJ SOLN: 25.0000 ug | INTRAMUSCULAR | Status: DC | PRN

## 2012-02-14 MED ORDER — KETOROLAC TROMETHAMINE 30 MG/ML IJ SOLN
INTRAMUSCULAR | Status: DC | PRN
  Administered 2012-02-14: 30 mg via INTRAVENOUS

## 2012-02-14 MED ORDER — MIDAZOLAM HCL 2 MG/2ML IJ SOLN
INTRAMUSCULAR | Status: AC
  Filled 2012-02-14: qty 2

## 2012-02-14 MED ORDER — ONDANSETRON HCL 4 MG/2ML IJ SOLN
INTRAMUSCULAR | Status: AC
  Filled 2012-02-14: qty 2

## 2012-02-14 MED ORDER — FENTANYL CITRATE 0.05 MG/ML IJ SOLN
INTRAMUSCULAR | Status: AC
  Filled 2012-02-14: qty 2

## 2012-02-14 MED ORDER — MIDAZOLAM HCL 5 MG/5ML IJ SOLN
INTRAMUSCULAR | Status: DC | PRN
  Administered 2012-02-14: 2 mg via INTRAVENOUS

## 2012-02-14 MED ORDER — DEXAMETHASONE SODIUM PHOSPHATE 10 MG/ML IJ SOLN
INTRAMUSCULAR | Status: AC
  Filled 2012-02-14: qty 1

## 2012-02-14 MED ORDER — LIDOCAINE HCL (CARDIAC) 20 MG/ML IV SOLN
INTRAVENOUS | Status: AC
  Filled 2012-02-14: qty 5

## 2012-02-14 MED ORDER — BUPIVACAINE HCL (PF) 0.25 % IJ SOLN
INTRAMUSCULAR | Status: DC | PRN
  Administered 2012-02-14: 30 mL

## 2012-02-14 MED ORDER — PROPOFOL 10 MG/ML IV EMUL
INTRAVENOUS | Status: DC | PRN
  Administered 2012-02-14: 20 mg via INTRAVENOUS
  Administered 2012-02-14 (×2): 50 mg via INTRAVENOUS
  Administered 2012-02-14 (×3): 20 mg via INTRAVENOUS

## 2012-02-14 MED ORDER — ONDANSETRON HCL 4 MG/2ML IJ SOLN
INTRAMUSCULAR | Status: DC | PRN
  Administered 2012-02-14: 4 mg via INTRAVENOUS

## 2012-02-14 MED ORDER — KETOROLAC TROMETHAMINE 30 MG/ML IJ SOLN
INTRAMUSCULAR | Status: AC
  Filled 2012-02-14: qty 1

## 2012-02-14 MED ORDER — TRAMADOL HCL 50 MG PO TABS: 50.0000 mg | ORAL_TABLET | Freq: Four times a day (QID) | ORAL | Status: DC | PRN

## 2012-02-14 MED ORDER — FENTANYL CITRATE 0.05 MG/ML IJ SOLN
INTRAMUSCULAR | Status: DC | PRN
  Administered 2012-02-14: 100 ug via INTRAVENOUS

## 2012-02-14 MED ORDER — PROPOFOL 10 MG/ML IV EMUL
INTRAVENOUS | Status: AC
  Filled 2012-02-14: qty 20

## 2012-02-14 MED ORDER — LACTATED RINGERS IV SOLN
INTRAVENOUS | Status: DC
  Administered 2012-02-14 (×2): via INTRAVENOUS

## 2012-02-14 MED ORDER — BUPIVACAINE HCL (PF) 0.25 % IJ SOLN
INTRAMUSCULAR | Status: AC
  Filled 2012-02-14: qty 30

## 2012-02-14 MED ORDER — DEXAMETHASONE SODIUM PHOSPHATE 10 MG/ML IJ SOLN
INTRAMUSCULAR | Status: DC | PRN
  Administered 2012-02-14: 10 mg via INTRAVENOUS

## 2012-02-14 SURGICAL SUPPLY — 21 items
CATH ROBINSON RED A/P 16FR (CATHETERS) ×2 IMPLANT
CLOTH BEACON ORANGE TIMEOUT ST (SAFETY) ×2 IMPLANT
DECANTER SPIKE VIAL GLASS SM (MISCELLANEOUS) ×2 IMPLANT
GLOVE BIO SURGEON STRL SZ7.5 (GLOVE) ×4 IMPLANT
GOWN PREVENTION PLUS XLARGE (GOWN DISPOSABLE) ×2 IMPLANT
GOWN STRL REIN XL XLG (GOWN DISPOSABLE) ×2 IMPLANT
KIT BERKELEY 1ST TRIMESTER 3/8 (MISCELLANEOUS) ×2 IMPLANT
NEEDLE SPNL 22GX3.5 QUINCKE BK (NEEDLE) ×2 IMPLANT
NS IRRIG 1000ML POUR BTL (IV SOLUTION) ×2 IMPLANT
PACK VAGINAL MINOR WOMEN LF (CUSTOM PROCEDURE TRAY) ×2 IMPLANT
PAD OB MATERNITY 4.3X12.25 (PERSONAL CARE ITEMS) ×2 IMPLANT
PAD PREP 24X48 CUFFED NSTRL (MISCELLANEOUS) ×2 IMPLANT
SET BERKELEY SUCTION TUBING (SUCTIONS) ×2 IMPLANT
SYR CONTROL 10ML LL (SYRINGE) ×2 IMPLANT
TOWEL OR 17X24 6PK STRL BLUE (TOWEL DISPOSABLE) ×4 IMPLANT
VACURETTE 10 RIGID CVD (CANNULA) IMPLANT
VACURETTE 7MM CVD STRL WRAP (CANNULA) ×2 IMPLANT
VACURETTE 7MM STRAIGHT (CANNULA) IMPLANT
VACURETTE 8 RIGID CVD (CANNULA) IMPLANT
VACURETTE 9 RIGID CVD (CANNULA) IMPLANT
VACURRETTE 7MM STRAIGHT (CANNULA)

## 2012-02-14 NOTE — Op Note (Signed)
[name], [name]              ACCOUNT NO.:  [id number]  MEDICAL RECORD NO.:  [id number]  LOCATION:  [facility]                          [facility]  PHYSICIAN:  Lenoard Aden, M.D.DATE OF BIRTH:  27-Nov-1975  DATE OF PROCEDURE: DATE OF DISCHARGE:  02/14/2012                              OPERATIVE REPORT   DESCRIPTION OF PROCEDURE:  After being apprised of the risks of anesthesia, infection, bleeding, injury to abdominal organs, possible need for repair, delayed versus immediate complications related to uterine perforation with possible need for repair, the patient's consent form was signed.  She was brought to the operating room and was administered general anesthetic without complications.  Prepped and draped in usual sterile fashion.  Catheterized to the bladder, was empty.  Exam under anesthesia reveals a mid position to anteflexed uterus, enlarged to 6-8 week size.  No adnexal masses.  The speculum placed, dilute paracervical block placed using a dilute Marcaine solution of 20 mL total.  Cervix was easily dilated up to 23 Pratt dilator, 7 mm suction curette placed.  Aspiration revealed products of conception which were identified via suction, collected and sent for chromosomal analysis.  Repeat curettage in a 4-quadrant method and suction confirms cavity to be empty.  Minimal bleeding noted.  All instruments removed.  The patient tolerated the procedure well, was awakened, and transferred to recovery in good condition.     Lenoard Aden, M.D.     RJT/MEDQ  D:  02/14/2012  T:  02/14/2012  Job:  [id number]

## 2012-02-14 NOTE — Anesthesia Postprocedure Evaluation (Signed)
  Anesthesia Post-op Note  Patient: [name]  Procedure(s) Performed: Procedure(s) (LRB) with comments: DILATATION AND EVACUATION (N/A) - chromosome studies  Patient is awake and responsive. Pain and nausea are reasonably well controlled. Vital signs are stable and clinically acceptable. Oxygen saturation is clinically acceptable. There are no apparent anesthetic complications at this time. Patient is ready for discharge.

## 2012-02-14 NOTE — Progress Notes (Signed)
Patient ID: [name], female   DOB: June 15, 1975, 36 y.o.   MRN: [id number] Patient seen and examined. Consent witnessed and signed. No changes noted. Update completed.

## 2012-02-14 NOTE — Op Note (Signed)
02/14/2012  9:07 AM  PATIENT:  [name]  36 y.o. female  PRE-OPERATIVE DIAGNOSIS:  Missed abortion Recurrent Pregnancy Loss  POST-OPERATIVE DIAGNOSIS:  Missed abortion  PROCEDURE:  Procedure(s): DILATATION AND EVACUATION  SURGEON:  Surgeon(s): Lenoard Aden, MD  ASSISTANTS: none   ANESTHESIA:   local and MAC  ESTIMATED BLOOD LOSS: minimal  DRAINS: none   LOCAL MEDICATIONS USED:  MARCAINE     SPECIMEN:  Source of Specimen:  PoC  DISPOSITION OF SPECIMEN:  PATHOLOGY  COUNTS:  YES  DICTATION #: [id number]  PLAN OF CARE: DC home  PATIENT DISPOSITION:  PACU - hemodynamically stable.

## 2012-02-14 NOTE — Transfer of Care (Signed)
Immediate Anesthesia Transfer of Care Note  Patient: [name]  Procedure(s) Performed: Procedure(s) (LRB) with comments: DILATATION AND EVACUATION (N/A) - chromosome studies  Patient Location: PACU  Anesthesia Type: MAC  Level of Consciousness: sedated  Airway & Oxygen Therapy: Patient Spontanous Breathing  Post-op Assessment: Report given to PACU RN  Post vital signs: Reviewed and stable  Complications: No apparent anesthesia complications

## 2012-02-14 NOTE — Anesthesia Preprocedure Evaluation (Signed)
Anesthesia Evaluation  Patient identified by MRN, date of birth, ID band Patient awake    Reviewed: Allergy & Precautions, H&P , Patient's Chart, lab work & pertinent test results, reviewed documented beta blocker date and time   Airway Mallampati: II TM Distance: >3 FB Neck ROM: full    Dental No notable dental hx.    Pulmonary  breath sounds clear to auscultation  Pulmonary exam normal       Cardiovascular Rhythm:regular Rate:Normal     Neuro/Psych    GI/Hepatic   Endo/Other    Renal/GU      Musculoskeletal   Abdominal   Peds  Hematology   Anesthesia Other Findings   Reproductive/Obstetrics                           Anesthesia Physical Anesthesia Plan  ASA: II  Anesthesia Plan: General and MAC   Post-op Pain Management:    Induction: Intravenous  Airway Management Planned: LMA and Mask  Additional Equipment:   Intra-op Plan:   Post-operative Plan:   Informed Consent: I have reviewed the patients History and Physical, chart, labs and discussed the procedure including the risks, benefits and alternatives for the proposed anesthesia with the patient or authorized representative who has indicated his/her understanding and acceptance.   Dental Advisory Given  Plan Discussed with: CRNA and Surgeon  Anesthesia Plan Comments: (  Discussed either MAC or general anesthesia, including possible nausea, instrumentation of airway, sore throat,pulmonary aspiration, etc. I asked if the were any outstanding questions, or  concerns before we proceeded. )        Anesthesia Quick Evaluation

## 2012-02-17 ENCOUNTER — Encounter (HOSPITAL_COMMUNITY): Payer: Self-pay | Admitting: Obstetrics and Gynecology

## 2012-02-27 LAB — CHROMOSOME STD, POC(TISSUE)-NCBH

## 2012-02-27 LAB — TISSUE HYBRIDIZATION TO NCBH

## 2012-06-06 ENCOUNTER — Other Ambulatory Visit: Payer: Self-pay

## 2012-08-06 LAB — OB RESULTS CONSOLE RPR: RPR: NONREACTIVE

## 2012-08-06 LAB — OB RESULTS CONSOLE HIV ANTIBODY (ROUTINE TESTING): HIV: NONREACTIVE

## 2012-08-06 LAB — OB RESULTS CONSOLE HEPATITIS B SURFACE ANTIGEN: Hepatitis B Surface Ag: NEGATIVE

## 2012-08-12 LAB — OB RESULTS CONSOLE GC/CHLAMYDIA: Chlamydia: NEGATIVE

## 2012-09-23 ENCOUNTER — Encounter: Payer: BC Managed Care – PPO | Attending: Obstetrics and Gynecology | Admitting: *Deleted

## 2012-09-23 VITALS — Ht 63.0 in | Wt 169.8 lb

## 2012-09-23 DIAGNOSIS — O99810 Abnormal glucose complicating pregnancy: Secondary | ICD-10-CM

## 2012-09-23 DIAGNOSIS — Z713 Dietary counseling and surveillance: Secondary | ICD-10-CM | POA: Insufficient documentation

## 2012-09-24 ENCOUNTER — Encounter: Payer: Self-pay | Admitting: *Deleted

## 2012-09-24 NOTE — Progress Notes (Signed)
  Patient was seen on 09/23/12 for Gestational Diabetes self-management class at the [facility]. The following learning objectives were met by the patient during this course:   States the definition of Gestational Diabetes  States why dietary management is important in controlling blood glucose  Describes the effects each nutrient has on blood glucose levels  Demonstrates ability to create a balanced meal plan  Demonstrates carbohydrate counting   States when to check blood glucose levels  Demonstrates proper blood glucose monitoring techniques  States the effect of stress and exercise on blood glucose levels  States the importance of limiting caffeine and abstaining from alcohol and smoking  Blood glucose monitor given:  One Touch Ultra Mini Self Monitoring Kit Lot # [id number] X Exp: 09/2013 Blood glucose reading: 152 mg/dl  Patient instructed to monitor glucose levels: FBS: 60 - <90 1 hour: <140 2 hour: <120  *Patient received handouts:  Nutrition Diabetes and Pregnancy  Carbohydrate Counting List  Patient will be seen for follow-up as needed.

## 2012-09-24 NOTE — Patient Instructions (Signed)
Goals:  Check glucose levels per MD as instructed  Follow Gestational Diabetes Diet as instructed  Call for follow-up as needed    

## 2012-12-17 ENCOUNTER — Telehealth: Payer: Self-pay | Admitting: Oncology

## 2012-12-17 NOTE — Telephone Encounter (Signed)
PT CALLED TO CONFIRMED 09/10 @ 9:30 W/DR. GRANFORTUNA.

## 2012-12-17 NOTE — Telephone Encounter (Signed)
LVOM FOR PT TO RETURN CALL IN RE TO NP APPT.  °

## 2012-12-17 NOTE — Telephone Encounter (Signed)
C/D 12/17/12 for appt. 12/30/12

## 2012-12-18 ENCOUNTER — Other Ambulatory Visit: Payer: Self-pay | Admitting: Oncology

## 2012-12-18 ENCOUNTER — Encounter: Payer: Self-pay | Admitting: Oncology

## 2012-12-18 DIAGNOSIS — O0990 Supervision of high risk pregnancy, unspecified, unspecified trimester: Secondary | ICD-10-CM

## 2012-12-18 DIAGNOSIS — D693 Immune thrombocytopenic purpura: Secondary | ICD-10-CM | POA: Insufficient documentation

## 2012-12-18 HISTORY — DX: Supervision of high risk pregnancy, unspecified, unspecified trimester: O09.90

## 2012-12-18 HISTORY — DX: Immune thrombocytopenic purpura: D69.3

## 2012-12-21 ENCOUNTER — Telehealth: Payer: Self-pay | Admitting: Oncology

## 2012-12-21 NOTE — Telephone Encounter (Signed)
Message to Tiffany re new pt lbs per 8/29 pof

## 2012-12-30 ENCOUNTER — Ambulatory Visit (HOSPITAL_BASED_OUTPATIENT_CLINIC_OR_DEPARTMENT_OTHER): Payer: BC Managed Care – PPO | Admitting: Oncology

## 2012-12-30 ENCOUNTER — Ambulatory Visit: Payer: BC Managed Care – PPO

## 2012-12-30 ENCOUNTER — Encounter: Payer: Self-pay | Admitting: Oncology

## 2012-12-30 ENCOUNTER — Other Ambulatory Visit (HOSPITAL_BASED_OUTPATIENT_CLINIC_OR_DEPARTMENT_OTHER): Payer: BC Managed Care – PPO | Admitting: Lab

## 2012-12-30 ENCOUNTER — Telehealth: Payer: Self-pay | Admitting: Oncology

## 2012-12-30 VITALS — BP 126/90 | HR 96 | Temp 97.7°F | Resp 18 | Ht 63.0 in | Wt 180.6 lb

## 2012-12-30 DIAGNOSIS — O0993 Supervision of high risk pregnancy, unspecified, third trimester: Secondary | ICD-10-CM

## 2012-12-30 DIAGNOSIS — D693 Immune thrombocytopenic purpura: Secondary | ICD-10-CM

## 2012-12-30 DIAGNOSIS — O0990 Supervision of high risk pregnancy, unspecified, unspecified trimester: Secondary | ICD-10-CM

## 2012-12-30 LAB — COMPREHENSIVE METABOLIC PANEL (CC13)
ALT: 7 U/L (ref 0–55)
AST: 10 U/L (ref 5–34)
Albumin: 2.7 g/dL — ABNORMAL LOW (ref 3.5–5.0)
Alkaline Phosphatase: 105 U/L (ref 40–150)
BUN: 4.5 mg/dL — ABNORMAL LOW (ref 7.0–26.0)
CO2: 23 mEq/L (ref 22–29)
Calcium: 8.6 mg/dL (ref 8.4–10.4)
Chloride: 109 mEq/L (ref 98–109)
Creatinine: 0.7 mg/dL (ref 0.6–1.1)
Glucose: 137 mg/dl (ref 70–140)
Potassium: 3.7 mEq/L (ref 3.5–5.1)
Sodium: 139 mEq/L (ref 136–145)
Total Bilirubin: 0.55 mg/dL (ref 0.20–1.20)
Total Protein: 6.2 g/dL — ABNORMAL LOW (ref 6.4–8.3)

## 2012-12-30 LAB — CBC & DIFF AND RETIC
BASO%: 0.1 % (ref 0.0–2.0)
Basophils Absolute: 0 10*3/uL (ref 0.0–0.1)
EOS%: 0.8 % (ref 0.0–7.0)
HGB: 12 g/dL (ref 11.6–15.9)
MCH: 30.9 pg (ref 25.1–34.0)
MCHC: 33 g/dL (ref 31.5–36.0)
MCV: 93.8 fL (ref 79.5–101.0)
MONO%: 7.8 % (ref 0.0–14.0)
RDW: 15.5 % — ABNORMAL HIGH (ref 11.2–14.5)
Retic %: 2.68 % — ABNORMAL HIGH (ref 0.70–2.10)
lymph#: 1.5 10*3/uL (ref 0.9–3.3)

## 2012-12-30 LAB — MORPHOLOGY: PLT EST: DECREASED

## 2012-12-30 LAB — CHCC SMEAR

## 2012-12-30 LAB — LACTATE DEHYDROGENASE (CC13): LDH: 150 U/L (ref 125–245)

## 2012-12-30 NOTE — Telephone Encounter (Signed)
GV ADN PRITNED APPT SCHED AND AVS FOR PT FOR SEPT AND OCT

## 2012-12-30 NOTE — Progress Notes (Signed)
Checked in new pt with no financial concerns. °

## 2012-12-30 NOTE — Progress Notes (Signed)
New Patient Hematology-Oncology Evaluation   [name] [id number] 1976-03-09 37 y.o. 12/30/2012  CC: Dr. 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 Her first pregnancy was 7 years ago in 2007.Marland Kitchen She was told that her platelet count was slightly decreased but no interventions were necessary. She had a C-section due to a breech presentation. She was evaluated in our office in February 2011. Platelet count was 130,000. Old records revealed that she had a mild thrombocytopenia going back at least to 2007. She had no obvious personal or family risk factors and she had a normal physical exam. Normal review of the peripheral blood film. She was seen again in followup on 05/22/2010 and platelet count was 141,000. No subsequent visits here since then. She became pregnant again in January 2013. Platelet count dipped to 106,000. She was told she had a blighted ovum and had a miscarriage. She was pregnant again in October 2013 and platelet count recorded as 119,000. She had another miscarriage at about 9 weeks. She is now in her third trimester of her fourth pregnancy. She is at 33 weeks. She is due on 02/12/2013. Elective C-section is planned. She's had recurrent gestational diabetes during this pregnancy. Her platelet count has again fallen with the lowest value recorded of 68,000 on August 14. Value in our office today 82,000. Of Possible significance is the fact that she has had a number of acute and chronic viral infections including chickenpox as a child, mononucleosis at age 11, recurrent genital herpes, condyloma acuminata. She believes she was negative for HPV. She did not have HPV DNA reflex criteria on a Pap smear done 08/12/2012 so HPV testing was not done. She had a negative chlamydia and gonorrhea and toxoplasmosis screen during this pregnancy. Negative  RPR, negative hepatitis B surface antigen, negative HIV. She reports no bleeding. No easy bruisability. No prior bleeding with wisdom tooth extraction or C-section. No other surgeries. No prior blood transfusion. There is no family history of any blood disorders. Her father was diagnosed with lung cancer last year at age 25 and had surgery. He also has coronary disease and has had a MI. She has one brother age 37 who is healthy.   PMH: Past Medical History  Diagnosis Date  . Diabetes mellitus without complication   . 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 Pregnancy, supervision for, high-risk 12/18/2012    Past Surgical History  Procedure Laterality Date  . Wisdom tooth extraction    . Cesarean section  2007  . Mirena iud removal  06/2010  . Dilation and evacuation  05/07/2011    Procedure: DILATATION AND EVACUATION;  Surgeon: Lenoard Aden, MD;  Location: [facility];  Service: Gynecology;  Laterality: N/A;  . Dilation and evacuation  02/14/2012    Procedure: DILATATION AND EVACUATION;  Surgeon: Lenoard Aden, MD;  Location: [facility];  Service: Gynecology;  Laterality: N/A;  chromosome studies    Allergies: No Known Allergies  Medications: DiaBeta 2.5 mg daily. Prenatal vitamins with iron.   Social History: Married. Healthy daughter age 39.  reports that she quit smoking about 3 years ago. Her smoking use included Cigarettes. She has a 1.25 pack-year smoking history. She has never used smokeless tobacco. She reports that  drinks alcohol. She reports that she does not use illicit drugs.  Family History: Family History  Problem Relation Age of  Onset  . Cancer Other   . Diabetes Other   . Heart attack Other     Review of Systems: Constitutional symptoms: No constitutional symptoms HEENT: No sore throat Respiratory: No cough or dyspnea Cardiovascular:  No chest pain or palpitations Gastrointestinal ROS: No abdominal pain no change in bowel habit Genito-Urinary ROS: No  vaginal bleeding Hematological and Lymphatic: Musculoskeletal: No muscle bone or joint pain Neurologic: No headache or change in vision Dermatologic: No rash or ecchymosis Remaining ROS negative.  Physical Exam: Blood pressure 126/90, pulse 96, temperature 97.7 F (36.5 C), temperature source Oral, resp. rate 18, height 5\' 3"  (1.6 m), weight 180 lb 9.6 oz (81.92 kg). Wt Readings from Last 3 Encounters:  12/30/12 180 lb 9.6 oz (81.92 kg)  09/24/12 169 lb 12.8 oz (77.021 kg)  02/14/12 162 lb (73.483 kg)    General appearance: Well-nourished Caucasian woman HENNT: Slight ptosis left eyelid Lymph nodes: No adenopathy Breasts: Lungs: Clear to auscultation resonant to percussion Heart: Regular rhythm no murmur gallop Vascular: No carotid bruits, no cyanosis Abdominal: Near-term pregnancy GU: Extremities: No edema, no calf tenderness Neurologic: Mental status and tach, slight ptosis left eyelid, PERRLA, optic disc sharp and vessels normal, motor strength 5 over 5, reflexes 1+ symmetric Skin: No rash or ecchymosis    Lab Results: Lab Results  Component Value Date   WBC 8.4 12/30/2012   HGB 12.0 12/30/2012   HCT 36.4 12/30/2012   MCV 93.8 12/30/2012   PLT 82* 12/30/2012     Chemistry      Component Value Date/Time   NA 139 12/30/2012 0927   NA 139 05/22/2010 0835   K 3.7 12/30/2012 0927   K 3.8 05/22/2010 0835   CL 102 05/22/2010 0835   CO2 23 12/30/2012 0927   CO2 24 05/22/2010 0835   BUN 4.5* 12/30/2012 0927   BUN 9 05/22/2010 0835   CREATININE 0.7 12/30/2012 0927   CREATININE 0.72 05/22/2010 0835      Component Value Date/Time   CALCIUM 8.6 12/30/2012 0927   CALCIUM 8.8 05/22/2010 0835   ALKPHOS 105 12/30/2012 0927   ALKPHOS 80 05/22/2010 0835   AST 10 12/30/2012 0927   AST 16 05/22/2010 0835   ALT 7 12/30/2012 0927   ALT 17 05/22/2010 0835   BILITOT 0.55 12/30/2012 0927   BILITOT 1.1 05/22/2010 0835         Review of peripheral blood film:  normochromic normocytic red cells.  Platelet morphology normal. Occasional large platelet which is benign finding. Approximately 10 platelets per high-power field estimated platelet count from smear and 150,000. Mature neutrophils and lymphocytes      Impression and Plan: Chronic, mild, thrombocytopenia with average platelet count 130,000 except during pregnancy when her counts have fallen below 100,000 at times.  It is possible that she has chronic mild suppression of her platelet count from previous multiple herpes virus infections. Alternatively, this could be a familial thrombocytopenia although platelet morphology appears normal on smear. 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 She had a single isolated platelet count of 68,000 recorded recently all her other values have been over 80,000.  Recommendation: She has agreed to weekly CBCs in our office until she has her C-section. She already has gestational diabetes so  I am reluctant to put her on steroids. We discussed the use either of IVIG or WinRho. If platelet count is consistently less than 80,000 at term I  will administer the IV Ig.      Levert Feinstein, MD 12/30/2012, 11:23 AM

## 2013-01-05 ENCOUNTER — Other Ambulatory Visit (HOSPITAL_BASED_OUTPATIENT_CLINIC_OR_DEPARTMENT_OTHER): Payer: BC Managed Care – PPO | Admitting: Lab

## 2013-01-05 DIAGNOSIS — D693 Immune thrombocytopenic purpura: Secondary | ICD-10-CM

## 2013-01-05 DIAGNOSIS — O0993 Supervision of high risk pregnancy, unspecified, third trimester: Secondary | ICD-10-CM

## 2013-01-05 LAB — CBC WITH DIFFERENTIAL/PLATELET
BASO%: 0.5 % (ref 0.0–2.0)
EOS%: 0.9 % (ref 0.0–7.0)
HCT: 35.4 % (ref 34.8–46.6)
LYMPH%: 19.2 % (ref 14.0–49.7)
MCH: 31.4 pg (ref 25.1–34.0)
MCHC: 33.7 g/dL (ref 31.5–36.0)
MONO%: 9.9 % (ref 0.0–14.0)
NEUT%: 69.5 % (ref 38.4–76.8)
Platelets: 79 10*3/uL — ABNORMAL LOW (ref 145–400)

## 2013-01-07 ENCOUNTER — Encounter: Payer: Self-pay | Admitting: Oncology

## 2013-01-07 ENCOUNTER — Telehealth: Payer: Self-pay | Admitting: *Deleted

## 2013-01-07 NOTE — Telephone Encounter (Signed)
Message copied by Sabino Snipes on Thu Jan 07, 2013  1:45 PM ------      Message from: Levert Feinstein      Created: Thu Jan 07, 2013 11:45 AM       Call pt platelets 79,000; 82,000 last week - insignificant change ------

## 2013-01-07 NOTE — Telephone Encounter (Signed)
Notified pt per Dr Cyndie Chime of platelet results & reported insignificant change per Dr Reece Agar.  She reports that she will be in next week for labs.

## 2013-01-12 ENCOUNTER — Other Ambulatory Visit (HOSPITAL_BASED_OUTPATIENT_CLINIC_OR_DEPARTMENT_OTHER): Payer: BC Managed Care – PPO

## 2013-01-12 DIAGNOSIS — O0993 Supervision of high risk pregnancy, unspecified, third trimester: Secondary | ICD-10-CM

## 2013-01-12 DIAGNOSIS — D693 Immune thrombocytopenic purpura: Secondary | ICD-10-CM

## 2013-01-12 LAB — CBC WITH DIFFERENTIAL/PLATELET
BASO%: 0.4 % (ref 0.0–2.0)
EOS%: 0.7 % (ref 0.0–7.0)
MCH: 30.9 pg (ref 25.1–34.0)
MCHC: 33.1 g/dL (ref 31.5–36.0)
NEUT%: 71.3 % (ref 38.4–76.8)
RBC: 4.01 10*6/uL (ref 3.70–5.45)
RDW: 15 % — ABNORMAL HIGH (ref 11.2–14.5)
lymph#: 1.7 10*3/uL (ref 0.9–3.3)

## 2013-01-19 ENCOUNTER — Telehealth: Payer: Self-pay | Admitting: *Deleted

## 2013-01-19 ENCOUNTER — Other Ambulatory Visit (HOSPITAL_BASED_OUTPATIENT_CLINIC_OR_DEPARTMENT_OTHER): Payer: BC Managed Care – PPO | Admitting: Lab

## 2013-01-19 DIAGNOSIS — D693 Immune thrombocytopenic purpura: Secondary | ICD-10-CM

## 2013-01-19 DIAGNOSIS — O0993 Supervision of high risk pregnancy, unspecified, third trimester: Secondary | ICD-10-CM

## 2013-01-19 LAB — CBC WITH DIFFERENTIAL/PLATELET
BASO%: 0.3 % (ref 0.0–2.0)
Basophils Absolute: 0 10*3/uL (ref 0.0–0.1)
EOS%: 0.6 % (ref 0.0–7.0)
HGB: 11.8 g/dL (ref 11.6–15.9)
MCH: 30.8 pg (ref 25.1–34.0)
NEUT#: 5.7 10*3/uL (ref 1.5–6.5)
RDW: 14.5 % (ref 11.2–14.5)
WBC: 8.1 10*3/uL (ref 3.9–10.3)
lymph#: 1.7 10*3/uL (ref 0.9–3.3)

## 2013-01-19 NOTE — Telephone Encounter (Signed)
Message copied by Sabino Snipes on Tue Jan 19, 2013 11:09 AM ------      Message from: Levert Feinstein      Created: Tue Jan 12, 2013  6:32 PM       Call pt: platelets stable at 81,000 ------

## 2013-01-19 NOTE — Telephone Encounter (Signed)
Pt informed of platelet result.  She had already received report on the day she came in.

## 2013-01-21 ENCOUNTER — Encounter: Payer: Self-pay | Admitting: Oncology

## 2013-01-25 ENCOUNTER — Telehealth: Payer: Self-pay | Admitting: *Deleted

## 2013-01-25 ENCOUNTER — Other Ambulatory Visit: Payer: Self-pay | Admitting: Oncology

## 2013-01-25 DIAGNOSIS — D693 Immune thrombocytopenic purpura: Secondary | ICD-10-CM

## 2013-01-25 NOTE — Telephone Encounter (Signed)
Called patient and let her know that Dr.Granfortuna has set up her IVIG.  She is to call [facility] [phone] at 6:30am Tues 10/7 and get time to come in for IVIG.  This will depend on bed availability.  She is to ask for [facility].  [name] appreciated the phone call.

## 2013-01-25 NOTE — Telephone Encounter (Signed)
Toniann Fail with [facility] called about this patient.  She has thrombocytopenia.  She is scheduled for a C-section on Friday 01/29/13 with Dr. Rosemary Holms.  She is requesting that Dr. Cyndie Chime give her IVIG and per patient that this needs to be done at least 48hrs. before C-section.  Please call [facility] back at [phone]  X [phone]. (Note to Dr. Cyndie Chime)

## 2013-01-26 ENCOUNTER — Other Ambulatory Visit: Payer: BC Managed Care – PPO

## 2013-01-26 ENCOUNTER — Observation Stay (HOSPITAL_COMMUNITY)
Admission: AD | Admit: 2013-01-26 | Discharge: 2013-01-26 | Disposition: A | Discharge status: Home / Self Care | Payer: BC Managed Care – PPO | Source: Ambulatory Visit | Attending: Obstetrics and Gynecology | Admitting: Obstetrics and Gynecology

## 2013-01-26 ENCOUNTER — Encounter (HOSPITAL_COMMUNITY): Payer: Self-pay | Admitting: *Deleted

## 2013-01-26 ENCOUNTER — Encounter (HOSPITAL_COMMUNITY): Payer: Self-pay | Admitting: Pharmacist

## 2013-01-26 DIAGNOSIS — D693 Immune thrombocytopenic purpura: Secondary | ICD-10-CM | POA: Insufficient documentation

## 2013-01-26 DIAGNOSIS — E119 Type 2 diabetes mellitus without complications: Secondary | ICD-10-CM | POA: Insufficient documentation

## 2013-01-26 DIAGNOSIS — D689 Coagulation defect, unspecified: Principal | ICD-10-CM | POA: Insufficient documentation

## 2013-01-26 DIAGNOSIS — O24919 Unspecified diabetes mellitus in pregnancy, unspecified trimester: Secondary | ICD-10-CM | POA: Insufficient documentation

## 2013-01-26 MED ORDER — ACETAMINOPHEN 325 MG PO TABS: 650.0000 mg | ORAL_TABLET | Freq: Four times a day (QID) | ORAL | Status: DC | PRN

## 2013-01-26 MED ORDER — ACETAMINOPHEN 325 MG PO TABS
650.0000 mg | ORAL_TABLET | Freq: Every day | ORAL | Status: DC
  Administered 2013-01-26: 650 mg via ORAL
  Filled 2013-01-26: qty 2

## 2013-01-26 MED ORDER — IMMUNE GLOBULIN (HUMAN) 10 GM/200ML IV SOLN
1.0000 g/kg | Freq: Every day | INTRAVENOUS | Status: DC
  Administered 2013-01-26: 80 g via INTRAVENOUS
  Filled 2013-01-26: qty 1600

## 2013-01-26 MED ORDER — DIPHENHYDRAMINE HCL 25 MG PO CAPS
50.0000 mg | ORAL_CAPSULE | Freq: Every day | ORAL | Status: DC
  Administered 2013-01-26: 50 mg via ORAL
  Filled 2013-01-26: qty 2

## 2013-01-26 NOTE — Progress Notes (Signed)
Octagam infusion completed at 1955.  VSS, no complaints - feeling good.  Baby active.  EFM applied with reactive fhr tracing - BL 140+ with 15x15 accelerations noted.  Abd palpated soft and non tender - denies U/C.  Plan of care discussed with pt.  IV to saline lock, flushed and covered.  To call in AM for return time - 2nd infusion of IVIG per orders.

## 2013-01-26 NOTE — Progress Notes (Signed)
Patient ID: [name], female   DOB: April 27, 1975, 37 y.o.   MRN: [id number] Admitted for IVIG Good FM. No contractions. No bleeding or LOF. BP 122/87  Pulse 81  Temp(Src) 98.7 F (37.1 C) (Oral)  Resp 20  Ht 5\' 3"  (1.6 m)  Wt 83.825 kg (184 lb 12.8 oz)  BMI 32.74 kg/m2  SpO2 99%  CBC    Component Value Date/Time   WBC 8.1 01/19/2013 1220   WBC 7.1 02/14/2012 0700   RBC 3.84 01/19/2013 1220   RBC 4.06 02/14/2012 0700   HGB 11.8 01/19/2013 1220   HGB 13.3 02/14/2012 0700   HCT 35.6 01/19/2013 1220   HCT 39.2 02/14/2012 0700   PLT 79* 01/19/2013 1220   PLT 119* 02/14/2012 0700   MCV 92.7 01/19/2013 1220   MCV 96.6 02/14/2012 0700   MCH 30.8 01/19/2013 1220   MCH 32.8 02/14/2012 0700   MCHC 33.3 01/19/2013 1220   MCHC 33.9 02/14/2012 0700   RDW 14.5 01/19/2013 1220   RDW 12.3 02/14/2012 0700   LYMPHSABS 1.7 01/19/2013 1220   MONOABS 0.7 01/19/2013 1220   EOSABS 0.0 01/19/2013 1220   BASOSABS 0.0 01/19/2013 1220    Lgs: CTA CV RRR  ABD: Gravid , NT No cvat Ext: neg c/c/e Neuro: non focal Skin intact  Reactive NSt  37 weeks ITP vs Gest Thrombocytopenia Class A2 DM  IVIG For Rpt csec

## 2013-01-27 ENCOUNTER — Other Ambulatory Visit: Payer: Self-pay | Admitting: Obstetrics and Gynecology

## 2013-01-27 ENCOUNTER — Encounter (HOSPITAL_COMMUNITY): Payer: Self-pay

## 2013-01-27 ENCOUNTER — Observation Stay (HOSPITAL_COMMUNITY)
Admission: AD | Admit: 2013-01-27 | Discharge: 2013-01-27 | Disposition: A | Discharge status: Hospice | Payer: BC Managed Care – PPO | Attending: Obstetrics and Gynecology | Admitting: Obstetrics and Gynecology

## 2013-01-27 DIAGNOSIS — D693 Immune thrombocytopenic purpura: Secondary | ICD-10-CM | POA: Insufficient documentation

## 2013-01-27 DIAGNOSIS — E119 Type 2 diabetes mellitus without complications: Secondary | ICD-10-CM | POA: Insufficient documentation

## 2013-01-27 DIAGNOSIS — D689 Coagulation defect, unspecified: Principal | ICD-10-CM | POA: Insufficient documentation

## 2013-01-27 DIAGNOSIS — O24919 Unspecified diabetes mellitus in pregnancy, unspecified trimester: Secondary | ICD-10-CM | POA: Insufficient documentation

## 2013-01-27 MED ORDER — IMMUNE GLOBULIN (HUMAN) 10 GM/200ML IV SOLN
80.0000 g | Freq: Once | INTRAVENOUS | Status: AC
  Administered 2013-01-27: 80 g via INTRAVENOUS
  Filled 2013-01-27: qty 1600

## 2013-01-27 MED ORDER — ACETAMINOPHEN 325 MG PO TABS
650.0000 mg | ORAL_TABLET | Freq: Once | ORAL | Status: AC
  Administered 2013-01-27: 650 mg via ORAL
  Filled 2013-01-27: qty 2

## 2013-01-27 MED ORDER — DIPHENHYDRAMINE HCL 25 MG PO CAPS
50.0000 mg | ORAL_CAPSULE | Freq: Once | ORAL | Status: AC
  Administered 2013-01-27: 50 mg via ORAL
  Filled 2013-01-27: qty 2

## 2013-01-27 MED ORDER — ACETAMINOPHEN 325 MG PO TABS: 650.0000 mg | ORAL_TABLET | Freq: Four times a day (QID) | ORAL | Status: DC | PRN

## 2013-01-27 NOTE — Progress Notes (Signed)
IVIG complete, 50cc IVF flush in progress. Pt. Without c/o or any adverse S&S. Will d/c home when flush complete

## 2013-01-27 NOTE — Progress Notes (Signed)
Patient ID: [name], female   DOB: January 02, 1976, 37 y.o.   MRN: [id number] Admitted for IVIG Good FM. No contractions. No bleeding or LOF. BP 128/85  Pulse 79  Temp(Src) 98.5 F (36.9 C) (Oral)  Resp 20  SpO2 97%  CBC    Component Value Date/Time   WBC 8.1 01/19/2013 1220   WBC 7.1 02/14/2012 0700   RBC 3.84 01/19/2013 1220   RBC 4.06 02/14/2012 0700   HGB 11.8 01/19/2013 1220   HGB 13.3 02/14/2012 0700   HCT 35.6 01/19/2013 1220   HCT 39.2 02/14/2012 0700   PLT 79* 01/19/2013 1220   PLT 119* 02/14/2012 0700   MCV 92.7 01/19/2013 1220   MCV 96.6 02/14/2012 0700   MCH 30.8 01/19/2013 1220   MCH 32.8 02/14/2012 0700   MCHC 33.3 01/19/2013 1220   MCHC 33.9 02/14/2012 0700   RDW 14.5 01/19/2013 1220   RDW 12.3 02/14/2012 0700   LYMPHSABS 1.7 01/19/2013 1220   MONOABS 0.7 01/19/2013 1220   EOSABS 0.0 01/19/2013 1220   BASOSABS 0.0 01/19/2013 1220    Lgs: CTA CV RRR  ABD: Gravid , NT No cvat Ext: neg c/c/e Neuro: non focal Skin intact  Reactive NST  37+ weeks ITP vs Gest Thrombocytopenia Class A2 DM  IVIG #2 For Rpt csec

## 2013-01-28 ENCOUNTER — Encounter (HOSPITAL_COMMUNITY)
Admission: RE | Admit: 2013-01-28 | Discharge: 2013-01-28 | Disposition: A | Discharge status: Home / Self Care | Payer: BC Managed Care – PPO | Source: Ambulatory Visit | Attending: Obstetrics and Gynecology | Admitting: Obstetrics and Gynecology

## 2013-01-28 ENCOUNTER — Encounter (HOSPITAL_COMMUNITY): Payer: Self-pay

## 2013-01-28 HISTORY — DX: Myoneural disorder, unspecified: G70.9

## 2013-01-28 LAB — CBC
HCT: 34.2 % — ABNORMAL LOW (ref 36.0–46.0)
Hemoglobin: 11.3 g/dL — ABNORMAL LOW (ref 12.0–15.0)
MCH: 30.5 pg (ref 26.0–34.0)
MCHC: 33 g/dL (ref 30.0–36.0)
MCV: 92.2 fL (ref 78.0–100.0)
Platelets: 72 10*3/uL — ABNORMAL LOW (ref 150–400)
RBC: 3.71 MIL/uL — ABNORMAL LOW (ref 3.87–5.11)
WBC: 4.4 10*3/uL (ref 4.0–10.5)

## 2013-01-28 LAB — RPR: RPR Ser Ql: NONREACTIVE

## 2013-01-28 NOTE — Progress Notes (Signed)
Post discharge chart review completed.  

## 2013-01-28 NOTE — Pre-Procedure Instructions (Signed)
Dr. Sherron Ales made aware of pt low platelet count of 72.  Pt has history of ITP. Dr. Jean Rosenthal made Dr. Billy Coast aware of current platelet count.  We will recheck in a.m. Prior ro surgery.

## 2013-01-28 NOTE — Patient Instructions (Signed)
Your procedure is scheduled on: 01/29/2013  Enter through the Main Entrance of [facility] at: 0730am  Pick up the phone at the desk and dial [phone].  Call this number if you have problems the morning of surgery: [phone].  Remember: Do NOT eat food: AFTER MIDNIGHT 01/28/2013 Do NOT drink clear liquids after: AFTER MIDNIGHT 01/28/2013 Take these medicines the morning of surgery with a SIP OF WATER: NONE  Do NOT wear jewelry (body piercing), make-up, or nail polish. Do NOT wear lotions, powders, or perfumes.  You may wear deoderant. Do NOT shave for 48 hours prior to surgery. Do NOT bring valuables to the hospital. Contacts, dentures, or bridgework may not be worn into surgery. Leave suitcase in car.  After surgery it may be brought to your room.  For patients admitted to the hospital, checkout time is 11:00 AM the day of discharge.

## 2013-01-28 NOTE — H&P (Signed)
NAMEHILARIE, [name]              ACCOUNT NO.:  [id number]  MEDICAL RECORD NO.:  [id number]  LOCATION:  PERIO                         FACILITY:  [facility]  PHYSICIAN:  Lenoard Aden, M.D.DATE OF BIRTH:  [birth year]  DATE OF ADMISSION:  01/29/2013 DATE OF DISCHARGE:  02/01/2013                             HISTORY & PHYSICAL   CHIEF COMPLAINT:  Class A2 diabetes mellitus, polyhydramnios, ITP at 38 weeks for repeat C-section.  HISTORY OF PRESENT ILLNESS:  She is a 37 year old white female, G5, P2; who presents now for repeat C-section due to ITP, status post IVIG, class A2 diabetes mellitus, and polyhydramnios.  ALLERGIES:  She has allergies to nothing.  MEDICATIONS:  Glyburide and prenatal vitamins.  FAMILY HISTORY:  Heart disease, colon cancer, liver cancer, breast cancer, and nicotine dependence.  OB/GYN HISTORY:  She has a history of 1 TAB, 2 SABs, and 1 C-section.  PAST SURGICAL HISTORY:  Remarkable for C-section x1.  Cryosurgery, D and E x2, hysteroscopy with IUD removal.  Prenatal course complicated by ITP with platelet count at approximately 80,000; status post IVIG.  PAST MEDICAL HISTORY: 1. Class A2 diabetes mellitus. 2. Borderline compliance of glyburide. 3. Polyhydramnios.  PHYSICAL EXAMINATION:  GENERAL:  She is a well-developed, well- nourished, white female; in no acute distress. HEENT:  Normal. NECK:  Supple.  Full range of motion. LUNGS:  Clear. HEART:  Regular. ABDOMEN:  Soft, gravid, nontender.  Cervix  Closed, 77%, vertex, -2. EXTREMITIES:  No cords. NEUROLOGIC:  Nonfocal. SKIN:  Intact.  IMPRESSION: 1. Thirty-eight week intrauterine pregnancy. 2. Previous C-section for repeat. 3. Class A2 diabetes mellitus. 4. Idiopathic thrombocytopenic purpura, status post IVIG. 5. Polyhydramnios.  PLAN:  To proceed with repeat low segment transverse cesarean section. Risks of anesthesia, infection, bleeding, injury to abdominal organs, need for repair is  discussed.  Delayed versus immediate complications to bowel and bladder injury noted, possible need for blood products was discussed, regional versus general anesthesia will be discussed with Anesthesia pending her hemoglobin count at the time admission.  She acknowledges these risks and will proceed.     Lenoard Aden, M.D.     RJT/MEDQ  D:  01/28/2013  T:  01/28/2013  Job:  [id number]

## 2013-01-29 ENCOUNTER — Encounter (HOSPITAL_COMMUNITY): Payer: BC Managed Care – PPO | Admitting: Anesthesiology

## 2013-01-29 ENCOUNTER — Encounter (HOSPITAL_COMMUNITY)
Admission: AD | Disposition: A | Discharge status: Home / Self Care | Payer: Self-pay | Source: Ambulatory Visit | Attending: Obstetrics and Gynecology

## 2013-01-29 ENCOUNTER — Encounter (HOSPITAL_COMMUNITY): Payer: Self-pay | Admitting: *Deleted

## 2013-01-29 ENCOUNTER — Inpatient Hospital Stay (HOSPITAL_COMMUNITY)
Admission: AD | Admit: 2013-01-29 | Discharge: 2013-02-01 | DRG: 370 | Disposition: A | Discharge status: Home / Self Care | Payer: BC Managed Care – PPO | Source: Ambulatory Visit | Attending: Obstetrics and Gynecology | Admitting: Obstetrics and Gynecology

## 2013-01-29 ENCOUNTER — Inpatient Hospital Stay (HOSPITAL_COMMUNITY): Payer: BC Managed Care – PPO | Admitting: Anesthesiology

## 2013-01-29 DIAGNOSIS — O409XX0 Polyhydramnios, unspecified trimester, not applicable or unspecified: Secondary | ICD-10-CM | POA: Diagnosis present

## 2013-01-29 DIAGNOSIS — D693 Immune thrombocytopenic purpura: Secondary | ICD-10-CM | POA: Diagnosis present

## 2013-01-29 DIAGNOSIS — D62 Acute posthemorrhagic anemia: Secondary | ICD-10-CM | POA: Diagnosis not present

## 2013-01-29 DIAGNOSIS — D689 Coagulation defect, unspecified: Principal | ICD-10-CM | POA: Diagnosis present

## 2013-01-29 DIAGNOSIS — O99814 Abnormal glucose complicating childbirth: Secondary | ICD-10-CM | POA: Diagnosis present

## 2013-01-29 DIAGNOSIS — O09529 Supervision of elderly multigravida, unspecified trimester: Secondary | ICD-10-CM | POA: Diagnosis present

## 2013-01-29 DIAGNOSIS — O34219 Maternal care for unspecified type scar from previous cesarean delivery: Secondary | ICD-10-CM | POA: Diagnosis present

## 2013-01-29 DIAGNOSIS — O9903 Anemia complicating the puerperium: Secondary | ICD-10-CM | POA: Diagnosis not present

## 2013-01-29 LAB — CBC
HCT: 36.6 % (ref 36.0–46.0)
Hemoglobin: 11.9 g/dL — ABNORMAL LOW (ref 12.0–15.0)
MCH: 30.1 pg (ref 26.0–34.0)
MCHC: 32.5 g/dL (ref 30.0–36.0)
MCV: 92.7 fL (ref 78.0–100.0)
RDW: 14.7 % (ref 11.5–15.5)

## 2013-01-29 LAB — COMPREHENSIVE METABOLIC PANEL
ALT: 8 U/L (ref 0–35)
AST: 16 U/L (ref 0–37)
Albumin: 2.5 g/dL — ABNORMAL LOW (ref 3.5–5.2)
Alkaline Phosphatase: 125 U/L — ABNORMAL HIGH (ref 39–117)
Calcium: 8.8 mg/dL (ref 8.4–10.5)
Chloride: 102 mEq/L (ref 96–112)
GFR calc Af Amer: >90 mL/min (ref >90)
Potassium: 4.1 mEq/L (ref 3.5–5.1)
Sodium: 132 mEq/L — ABNORMAL LOW (ref 135–145)
Total Bilirubin: 0.3 mg/dL (ref 0.3–1.2)
Total Protein: 8.4 g/dL — ABNORMAL HIGH (ref 6.0–8.3)

## 2013-01-29 SURGERY — Surgical Case
Anesthesia: Spinal | Site: Abdomen | Wound class: Clean Contaminated

## 2013-01-29 MED ORDER — ONDANSETRON HCL 4 MG/2ML IJ SOLN
INTRAMUSCULAR | Status: DC | PRN
  Administered 2013-01-29: 4 mg via INTRAMUSCULAR

## 2013-01-29 MED ORDER — DIBUCAINE 1 % RE OINT: 1.0000 "application " | TOPICAL_OINTMENT | RECTAL | Status: DC | PRN

## 2013-01-29 MED ORDER — SIMETHICONE 80 MG PO CHEW
80.0000 mg | CHEWABLE_TABLET | Freq: Three times a day (TID) | ORAL | Status: DC
  Administered 2013-01-30 – 2013-02-01 (×9): 80 mg via ORAL
  Filled 2013-01-29 (×7): qty 1

## 2013-01-29 MED ORDER — NALBUPHINE HCL 10 MG/ML IJ SOLN
5.0000 mg | INTRAMUSCULAR | Status: DC | PRN
  Filled 2013-01-29: qty 1

## 2013-01-29 MED ORDER — FENTANYL CITRATE 0.05 MG/ML IJ SOLN
INTRAMUSCULAR | Status: AC
  Filled 2013-01-29: qty 2

## 2013-01-29 MED ORDER — OXYCODONE-ACETAMINOPHEN 5-325 MG PO TABS
1.0000 | ORAL_TABLET | ORAL | Status: DC | PRN
  Administered 2013-01-29 – 2013-01-30 (×2): 1 via ORAL
  Administered 2013-01-30 (×2): 2 via ORAL
  Administered 2013-01-30: 1 via ORAL
  Administered 2013-01-30 – 2013-02-01 (×7): 2 via ORAL
  Administered 2013-02-01: 1 via ORAL
  Filled 2013-01-29: qty 2
  Filled 2013-01-29: qty 1
  Filled 2013-01-29 (×3): qty 2
  Filled 2013-01-29: qty 1
  Filled 2013-01-29 (×2): qty 2
  Filled 2013-01-29 (×2): qty 1
  Filled 2013-01-29: qty 2
  Filled 2013-01-29 (×2): qty 1
  Filled 2013-01-29 (×2): qty 2

## 2013-01-29 MED ORDER — METHYLERGONOVINE MALEATE 0.2 MG/ML IJ SOLN: 0.2000 mg | INTRAMUSCULAR | Status: DC | PRN

## 2013-01-29 MED ORDER — CEFAZOLIN SODIUM-DEXTROSE 2-3 GM-% IV SOLR: 2.0000 g | INTRAVENOUS | Status: DC

## 2013-01-29 MED ORDER — SCOPOLAMINE 1 MG/3DAYS TD PT72
MEDICATED_PATCH | TRANSDERMAL | Status: AC
  Administered 2013-01-29: 1.5 mg via TRANSDERMAL
  Filled 2013-01-29: qty 1

## 2013-01-29 MED ORDER — ZOLPIDEM TARTRATE 5 MG PO TABS: 5.0000 mg | ORAL_TABLET | Freq: Every evening | ORAL | Status: DC | PRN

## 2013-01-29 MED ORDER — METHYLERGONOVINE MALEATE 0.2 MG PO TABS: 0.2000 mg | ORAL_TABLET | ORAL | Status: DC | PRN

## 2013-01-29 MED ORDER — SCOPOLAMINE 1 MG/3DAYS TD PT72
1.0000 | MEDICATED_PATCH | Freq: Once | TRANSDERMAL | Status: DC
  Administered 2013-01-29: 1.5 mg via TRANSDERMAL

## 2013-01-29 MED ORDER — METOCLOPRAMIDE HCL 5 MG/ML IJ SOLN: 10.0000 mg | Freq: Three times a day (TID) | INTRAMUSCULAR | Status: DC | PRN

## 2013-01-29 MED ORDER — FENTANYL CITRATE 0.05 MG/ML IJ SOLN: 25.0000 ug | INTRAMUSCULAR | Status: DC | PRN

## 2013-01-29 MED ORDER — LANOLIN HYDROUS EX OINT: 1.0000 "application " | TOPICAL_OINTMENT | CUTANEOUS | Status: DC | PRN

## 2013-01-29 MED ORDER — PHENYLEPHRINE 40 MCG/ML (10ML) SYRINGE FOR IV PUSH (FOR BLOOD PRESSURE SUPPORT)
PREFILLED_SYRINGE | INTRAVENOUS | Status: AC
  Filled 2013-01-29: qty 5

## 2013-01-29 MED ORDER — NALOXONE HCL 0.4 MG/ML IJ SOLN: 0.4000 mg | INTRAMUSCULAR | Status: DC | PRN

## 2013-01-29 MED ORDER — MENTHOL 3 MG MT LOZG: 1.0000 | LOZENGE | OROMUCOSAL | Status: DC | PRN

## 2013-01-29 MED ORDER — DIPHENHYDRAMINE HCL 50 MG/ML IJ SOLN: 25.0000 mg | INTRAMUSCULAR | Status: DC | PRN

## 2013-01-29 MED ORDER — PRENATAL MULTIVITAMIN CH
1.0000 | ORAL_TABLET | Freq: Every day | ORAL | Status: DC
  Administered 2013-01-30 – 2013-02-01 (×3): 1 via ORAL
  Filled 2013-01-29 (×3): qty 1

## 2013-01-29 MED ORDER — SCOPOLAMINE 1 MG/3DAYS TD PT72
1.0000 | MEDICATED_PATCH | Freq: Once | TRANSDERMAL | Status: DC
  Filled 2013-01-29: qty 1

## 2013-01-29 MED ORDER — OXYTOCIN 10 UNIT/ML IJ SOLN
INTRAMUSCULAR | Status: AC
  Filled 2013-01-29: qty 4

## 2013-01-29 MED ORDER — ONDANSETRON HCL 4 MG PO TABS: 4.0000 mg | ORAL_TABLET | ORAL | Status: DC | PRN

## 2013-01-29 MED ORDER — DIPHENHYDRAMINE HCL 25 MG PO CAPS: 25.0000 mg | ORAL_CAPSULE | ORAL | Status: DC | PRN

## 2013-01-29 MED ORDER — PHENYLEPHRINE HCL 10 MG/ML IJ SOLN
INTRAMUSCULAR | Status: DC | PRN
  Administered 2013-01-29: 40 ug via INTRAVENOUS
  Administered 2013-01-29 (×2): 80 ug via INTRAVENOUS

## 2013-01-29 MED ORDER — CEFAZOLIN SODIUM-DEXTROSE 2-3 GM-% IV SOLR
INTRAVENOUS | Status: AC
  Administered 2013-01-29: 2 g via INTRAVENOUS
  Filled 2013-01-29: qty 50

## 2013-01-29 MED ORDER — WITCH HAZEL-GLYCERIN EX PADS: 1.0000 "application " | MEDICATED_PAD | CUTANEOUS | Status: DC | PRN

## 2013-01-29 MED ORDER — DIPHENHYDRAMINE HCL 25 MG PO CAPS: 25.0000 mg | ORAL_CAPSULE | Freq: Four times a day (QID) | ORAL | Status: DC | PRN

## 2013-01-29 MED ORDER — NALOXONE HCL 1 MG/ML IJ SOLN
1.0000 ug/kg/h | INTRAVENOUS | Status: DC | PRN
  Filled 2013-01-29: qty 2

## 2013-01-29 MED ORDER — ONDANSETRON HCL 4 MG/2ML IJ SOLN: 4.0000 mg | INTRAMUSCULAR | Status: DC | PRN

## 2013-01-29 MED ORDER — LACTATED RINGERS IV SOLN
INTRAVENOUS | Status: DC
  Administered 2013-01-29 – 2013-01-30 (×2): via INTRAVENOUS

## 2013-01-29 MED ORDER — DIPHENHYDRAMINE HCL 50 MG/ML IJ SOLN: 12.5000 mg | INTRAMUSCULAR | Status: DC | PRN

## 2013-01-29 MED ORDER — SODIUM CHLORIDE 0.9 % IJ SOLN: 3.0000 mL | INTRAMUSCULAR | Status: DC | PRN

## 2013-01-29 MED ORDER — ONDANSETRON HCL 4 MG/2ML IJ SOLN
INTRAMUSCULAR | Status: AC
  Filled 2013-01-29: qty 2

## 2013-01-29 MED ORDER — TETANUS-DIPHTH-ACELL PERTUSSIS 5-2.5-18.5 LF-MCG/0.5 IM SUSP: 0.5000 mL | Freq: Once | INTRAMUSCULAR | Status: DC

## 2013-01-29 MED ORDER — BUPIVACAINE IN DEXTROSE 0.75-8.25 % IT SOLN
INTRATHECAL | Status: DC | PRN
  Administered 2013-01-29: 1.4 mL via INTRATHECAL

## 2013-01-29 MED ORDER — LACTATED RINGERS IV SOLN
INTRAVENOUS | Status: DC
  Administered 2013-01-29 (×3): via INTRAVENOUS

## 2013-01-29 MED ORDER — BUPIVACAINE HCL (PF) 0.25 % IJ SOLN
INTRAMUSCULAR | Status: AC
  Filled 2013-01-29: qty 30

## 2013-01-29 MED ORDER — MEPERIDINE HCL 25 MG/ML IJ SOLN: 6.2500 mg | INTRAMUSCULAR | Status: DC | PRN

## 2013-01-29 MED ORDER — LACTATED RINGERS IV BOLUS (SEPSIS)
500.0000 mL | Freq: Once | INTRAVENOUS | Status: AC
  Administered 2013-01-29: 500 mL via INTRAVENOUS

## 2013-01-29 MED ORDER — SENNOSIDES-DOCUSATE SODIUM 8.6-50 MG PO TABS
2.0000 | ORAL_TABLET | ORAL | Status: DC
  Administered 2013-01-30 – 2013-02-01 (×2): 2 via ORAL
  Filled 2013-01-29 (×2): qty 2

## 2013-01-29 MED ORDER — ONDANSETRON HCL 4 MG/2ML IJ SOLN
4.0000 mg | Freq: Three times a day (TID) | INTRAMUSCULAR | Status: DC | PRN
  Filled 2013-01-29: qty 2

## 2013-01-29 MED ORDER — BUPIVACAINE HCL (PF) 0.25 % IJ SOLN
INTRAMUSCULAR | Status: DC | PRN
  Administered 2013-01-29: 10 mL

## 2013-01-29 MED ORDER — MORPHINE SULFATE (PF) 0.5 MG/ML IJ SOLN
INTRAMUSCULAR | Status: DC | PRN
  Administered 2013-01-29: .15 mg via INTRATHECAL

## 2013-01-29 MED ORDER — OXYTOCIN 40 UNITS IN LACTATED RINGERS INFUSION - SIMPLE MED: 62.5000 mL/h | INTRAVENOUS | Status: AC

## 2013-01-29 MED ORDER — FENTANYL CITRATE 0.05 MG/ML IJ SOLN
INTRAMUSCULAR | Status: DC | PRN
  Administered 2013-01-29: 25 ug via INTRATHECAL

## 2013-01-29 MED ORDER — MORPHINE SULFATE 0.5 MG/ML IJ SOLN
INTRAMUSCULAR | Status: AC
  Filled 2013-01-29: qty 10

## 2013-01-29 MED ORDER — OXYTOCIN 10 UNIT/ML IJ SOLN
40.0000 [IU] | INTRAVENOUS | Status: DC | PRN
  Administered 2013-01-29: 40 [IU] via INTRAVENOUS

## 2013-01-29 SURGICAL SUPPLY — 33 items
CLAMP CORD UMBIL (MISCELLANEOUS) IMPLANT
CLOTH BEACON ORANGE TIMEOUT ST (SAFETY) ×2 IMPLANT
CONTAINER PREFILL 10% NBF 15ML (MISCELLANEOUS) IMPLANT
DRAPE LG THREE QUARTER DISP (DRAPES) ×4 IMPLANT
DRAPE WARM FLUID 44X44 (DRAPE) IMPLANT
DRESSING TELFA 8X3 (GAUZE/BANDAGES/DRESSINGS) ×2 IMPLANT
DRSG OPSITE POSTOP 4X10 (GAUZE/BANDAGES/DRESSINGS) ×2 IMPLANT
DURAPREP 26ML APPLICATOR (WOUND CARE) ×2 IMPLANT
ELECT REM PT RETURN 9FT ADLT (ELECTROSURGICAL) ×2
ELECTRODE REM PT RTRN 9FT ADLT (ELECTROSURGICAL) ×1 IMPLANT
EXTRACTOR VACUUM M CUP 4 TUBE (SUCTIONS) IMPLANT
GAUZE SPONGE 4X4 12PLY STRL LF (GAUZE/BANDAGES/DRESSINGS) ×2 IMPLANT
GLOVE BIO SURGEON STRL SZ7.5 (GLOVE) ×2 IMPLANT
GOWN PREVENTION PLUS XLARGE (GOWN DISPOSABLE) ×2 IMPLANT
GOWN STRL REIN XL XLG (GOWN DISPOSABLE) ×2 IMPLANT
KIT ABG SYR 3ML LUER SLIP (SYRINGE) IMPLANT
NEEDLE HYPO 25X1 1.5 SAFETY (NEEDLE) ×2 IMPLANT
NEEDLE HYPO 25X5/8 SAFETYGLIDE (NEEDLE) IMPLANT
NS IRRIG 1000ML POUR BTL (IV SOLUTION) ×2 IMPLANT
PACK C SECTION WH (CUSTOM PROCEDURE TRAY) ×2 IMPLANT
PAD ABD 7.5X8 STRL (GAUZE/BANDAGES/DRESSINGS) ×2 IMPLANT
STAPLER VISISTAT 35W (STAPLE) ×2 IMPLANT
SUT MNCRL 0 VIOLET CTX 36 (SUTURE) ×2 IMPLANT
SUT MON AB 2-0 CT1 27 (SUTURE) ×2 IMPLANT
SUT MON AB-0 CT1 36 (SUTURE) ×4 IMPLANT
SUT MONOCRYL 0 CTX 36 (SUTURE) ×2
SUT PLAIN 0 NONE (SUTURE) IMPLANT
SUT PLAIN 2 0 XLH (SUTURE) IMPLANT
SYR CONTROL 10ML LL (SYRINGE) ×2 IMPLANT
TAPE CLOTH SURG 4X10 WHT LF (GAUZE/BANDAGES/DRESSINGS) ×2 IMPLANT
TOWEL OR 17X24 6PK STRL BLUE (TOWEL DISPOSABLE) ×2 IMPLANT
TRAY FOLEY CATH 14FR (SET/KITS/TRAYS/PACK) ×2 IMPLANT
WATER STERILE IRR 1000ML POUR (IV SOLUTION) ×2 IMPLANT

## 2013-01-29 NOTE — OR Nursing (Signed)
Placenta to OR Refrigerator. 

## 2013-01-29 NOTE — Anesthesia Preprocedure Evaluation (Signed)
Anesthesia Evaluation  Patient identified by MRN, date of birth, ID band Patient awake    Reviewed: Allergy & Precautions, H&P , NPO status , Patient's Chart, lab work & pertinent test results  Airway Mallampati: III TM Distance: >3 FB Neck ROM: Full    Dental no notable dental hx. (+) Teeth Intact and Caps   Pulmonary neg pulmonary ROS, former smoker,  breath sounds clear to auscultation  Pulmonary exam normal       Cardiovascular negative cardio ROS  Rhythm:Regular Rate:Normal     Neuro/Psych  Neuromuscular disease negative psych ROS   GI/Hepatic negative GI ROS, Neg liver ROS,   Endo/Other  diabetes, Well Controlled, Gestational, Oral Hypoglycemic AgentsObesity  Renal/GU negative Renal ROS  negative genitourinary   Musculoskeletal negative musculoskeletal ROS (+)   Abdominal (+) + obese,   Peds  Hematology Gestational Thrombocytopenia/ ITP- seeing Hematology Received IgG this week   Anesthesia Other Findings   Reproductive/Obstetrics (+) Pregnancy Previous C/Section for Breech                           Anesthesia Physical Anesthesia Plan  ASA: II  Anesthesia Plan: Spinal   Post-op Pain Management:    Induction:   Airway Management Planned: Natural Airway  Additional Equipment:   Intra-op Plan:   Post-operative Plan:   Informed Consent: I have reviewed the patients History and Physical, chart, labs and discussed the procedure including the risks, benefits and alternatives for the proposed anesthesia with the patient or authorized representative who has indicated his/her understanding and acceptance.   Dental advisory given  Plan Discussed with: CRNA, Anesthesiologist and Surgeon  Anesthesia Plan Comments:         Anesthesia Quick Evaluation

## 2013-01-29 NOTE — Transfer of Care (Signed)
Immediate Anesthesia Transfer of Care Note  Patient: [name]  Procedure(s) Performed: Procedure(s): Repeat CESAREAN SECTION (N/A)  Patient Location: PACU  Anesthesia Type:Spinal  Level of Consciousness: awake, alert  and oriented  Airway & Oxygen Therapy: Patient Spontanous Breathing  Post-op Assessment: Report given to PACU RN and Post -op Vital signs reviewed and stable  Post vital signs: stable  Complications: No apparent anesthesia complications

## 2013-01-29 NOTE — Progress Notes (Signed)
Patient ID: [name], female   DOB: 04/04/1976, 37 y.o.   MRN: [id number] Patient seen and examined. Consent witnessed and signed. No changes noted. Update completed. CBC    Component Value Date/Time   WBC 5.3 01/29/2013 0745   WBC 8.1 01/19/2013 1220   RBC 3.95 01/29/2013 0745   RBC 3.84 01/19/2013 1220   HGB 11.9* 01/29/2013 0745   HGB 11.8 01/19/2013 1220   HCT 36.6 01/29/2013 0745   HCT 35.6 01/19/2013 1220   PLT 63* 01/29/2013 0745   PLT 79* 01/19/2013 1220   MCV 92.7 01/29/2013 0745   MCV 92.7 01/19/2013 1220   MCH 30.1 01/29/2013 0745   MCH 30.8 01/19/2013 1220   MCHC 32.5 01/29/2013 0745   MCHC 33.3 01/19/2013 1220   RDW 14.7 01/29/2013 0745   RDW 14.5 01/19/2013 1220   LYMPHSABS 1.7 01/19/2013 1220   MONOABS 0.7 01/19/2013 1220   EOSABS 0.0 01/19/2013 1220   BASOSABS 0.0 01/19/2013 1220    Discussed with anesthesia. Will do spinal. Hematology notified. Blood bank notified Pt counseled about possible need for blood products.

## 2013-01-29 NOTE — Anesthesia Procedure Notes (Signed)
Spinal  Patient location during procedure: OR Start time: 01/29/2013 9:14 AM Staffing Anesthesiologist: Romona Murdy A. Performed by: anesthesiologist  Preanesthetic Checklist Completed: patient identified, site marked, surgical consent, pre-op evaluation, timeout performed, IV checked, risks and benefits discussed and monitors and equipment checked Spinal Block Patient position: sitting Prep: site prepped and draped and DuraPrep Patient monitoring: heart rate, cardiac monitor, continuous pulse ox and blood pressure Approach: midline Location: L4-5 Injection technique: single-shot Needle Needle type: Sprotte  Needle gauge: 24 G Needle length: 9 cm Needle insertion depth: 5 cm Assessment Sensory level: T4 Additional Notes Patient tolerated procedure well. Adequate sensory level. Minimal bleeding at insertion site. Easy spinal.

## 2013-01-29 NOTE — Anesthesia Postprocedure Evaluation (Signed)
Anesthesia Post Note  Patient: [name]  Procedure(s) Performed: Procedure(s) (LRB): Repeat CESAREAN SECTION (N/A)  Anesthesia type: Spinal  Patient location: Mother/Baby  Post pain: Pain level controlled  Post assessment: Post-op Vital signs reviewed  Last Vitals:  Filed Vitals:   01/29/13 1510  BP: 115/85  Pulse: 91  Temp: 36.8 C  Resp: 20    Post vital signs: Reviewed  Level of consciousness: awake  Complications: No apparent anesthesia complications

## 2013-01-29 NOTE — Op Note (Signed)
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  Post-operative Diagnosis: same  Surgeon: Lenoard Aden   Assistants: none  Anesthesia: Local anesthesia 0.25.% bupivacaine and Spinal anesthesia  ASA Class: 2  Procedure Details  The patient was seen in the Holding Room. The risks, benefits, complications, treatment options, and expected outcomes were discussed with the patient.  The patient concurred with the proposed plan, giving informed consent. The risks of anesthesia, infection, bleeding and possible injury to other organs discussed. Injury to bowel, bladder, or ureter with possible need for repair discussed. Possible need for transfusion with secondary risks of hepatitis or HIV acquisition discussed. Post operative complications to include but not limited to DVT, PE and Pneumonia noted. The site of surgery properly noted/marked. The patient was taken to Operating Room # 9, identified as REENE HARLACHER and the procedure verified as C-Section Delivery. A Time Out was held and the above information confirmed.  After induction of anesthesia, the patient was draped and prepped in the usual sterile manner. A Pfannenstiel incision was made and carried down through the subcutaneous tissue to the fascia. Fascial incision was made and extended transversely using Mayo scissors. The fascia was separated from the underlying rectus tissue superiorly and inferiorly. The peritoneum was identified and entered. Peritoneal incision was extended longitudinally. The utero-vesical peritoneal reflection was incised transversely and the bladder flap was bluntly freed from the lower uterine segment. A low transverse uterine incision(Kerr hysterotomy) was made. Delivered from OT with Vacuum assistance presentation was a  female with Apgar scores of 9 at one minute and 9 at five minutes.  Bulb suctioning gently performed. Neonatal team in attendance.After the umbilical cord was clamped and cut cord blood was obtained for evaluation. The placenta was removed intact and appeared normal. The uterus was curetted with a dry lap pack. Good hemostasis was noted.The uterine outline, tubes and ovaries appeared normal. The uterine incision was closed with running locked sutures of 0 Monocryl x 2 layers. Hemostasis was observed. Lavage was carried out until clear.The parietal peritoneum was closed with a running 2-0 Monocryl suture. The fascia was then reapproximated with running sutures of 0 Monocryl. 2-0 plain to close the [facility] tissue. The skin was reapproximated with staples.  Instrument, sponge, and needle counts were correct prior the abdominal closure and at the conclusion of the case.   Findings: FTLM, OT, posterior placenta, bladder flap adhesions, nl tubes and ovaries  Estimated Blood Loss:  500         Drains: foley                 Specimens: placenta                 Complications:  None; patient tolerated the procedure well.         Disposition: PACU - hemodynamically stable.         Condition: stable  Attending Attestation: I performed the procedure.

## 2013-01-29 NOTE — Anesthesia Postprocedure Evaluation (Signed)
  Anesthesia Post-op Note  Patient: [name]  Procedure(s) Performed: Procedure(s): Repeat CESAREAN SECTION (N/A)  Patient Location: PACU  Anesthesia Type:Spinal  Level of Consciousness: awake, alert  and oriented  Airway and Oxygen Therapy: Patient Spontanous Breathing  Post-op Pain: none  Post-op Assessment: Post-op Vital signs reviewed, Patient's Cardiovascular Status Stable, Respiratory Function Stable, Patent Airway, No signs of Nausea or vomiting, Pain level controlled, No headache, No backache, No residual numbness and No residual motor weakness  Post-op Vital Signs: Reviewed and stable  Complications: No apparent anesthesia complications

## 2013-01-30 LAB — CBC
MCV: 91.7 fL (ref 78.0–100.0)
Platelets: 55 10*3/uL — ABNORMAL LOW (ref 150–400)
RBC: 2.88 MIL/uL — ABNORMAL LOW (ref 3.87–5.11)
RDW: 14.8 % (ref 11.5–15.5)
WBC: 4.4 10*3/uL (ref 4.0–10.5)

## 2013-01-30 NOTE — Progress Notes (Signed)
POD # 1  Subjective: Pt reports feeling good, sore when ambulating / Pain controlled with Percocet Tolerating po/ Voiding without problems/ No n/v/ Flatus present Activity: ad lib Bleeding is light No dizziness, SOB, or chest pain No bleeding or bruising Newborn info:  Information for the patient's newborn:  Mauricia, Mertens [[id number]]  female Circumcision: planning/ Feeding: breastpumping, baby in NICU   Objective:  VS:  Filed Vitals:   01/29/13 2207 01/30/13 0000 01/30/13 0400 01/30/13 1000  BP: 122/81 118/81 118/80 129/87  Pulse: 98 92 81 92  Temp: 99.8 F (37.7 C) 98.1 F (36.7 C) 98.6 F (37 C) 98.3 F (36.8 C)  TempSrc: Oral Oral Oral Oral  Resp: 20 18 18 20   Height:      Weight:      SpO2: 96% 98% 96% 97%     I&O: Intake/Output     10/10 0701 - 10/11 0700 10/11 0701 - 10/12 0700   P.O. 1470    I.V. (mL/kg) 3315.8 (38.7)    IV Piggyback 500    Total Intake(mL/kg) 5285.8 (61.7)    Urine (mL/kg/hr) 2410 500 (0.9)   Blood 730    Total Output 3140 500   Net +2145.8 -500           Recent Labs  01/29/13 0745 01/30/13 0500  WBC 5.3 4.4  HGB 11.9* 8.8*  HCT 36.6 26.4*  PLT 63* 55*    Blood type: --/--/A POS (10/09 1000) Rubella: Immune (04/17 1447)    Physical Exam:  General: alert and cooperative CV: Regular rate and rhythm Resp: clear Abdomen: soft, nontender, normal bowel sounds Incision: pressure dressing c/d/i Uterine Fundus: firm, below umbilicus, nontender Lochia: minimal Ext: extremities normal, atraumatic, no cyanosis or edema and Homans sign is negative, no sign of DVT    Assessment: POD # 1/ G5P1032/ S/P C/Section d/t repeat A2DM, delivered ITP, delivered Doing well  Plan: Ambulate Repeat platelet count in am Continue routine post op orders   Signed: Donette Larry, Dorris Carnes, MSN, CNM 01/30/2013, 1:35 PM

## 2013-01-31 LAB — CBC
HCT: 25.7 % — ABNORMAL LOW (ref 36.0–46.0)
Hemoglobin: 8.5 g/dL — ABNORMAL LOW (ref 12.0–15.0)
MCH: 30.6 pg (ref 26.0–34.0)
MCHC: 33.1 g/dL (ref 30.0–36.0)
RBC: 2.78 MIL/uL — ABNORMAL LOW (ref 3.87–5.11)
WBC: 3.8 10*3/uL — ABNORMAL LOW (ref 4.0–10.5)

## 2013-01-31 NOTE — Progress Notes (Signed)
POD # 2  Subjective: Pt reports feeling good/ Pain controlled with Percocet Tolerating po/Voiding without problems/ No n/v/ Flatus present No dizziness, SOB, or chest pain Activity: ad lib Bleeding is light Newborn info:  Information for the patient's newborn:  Layali, Freund [[id number]]  female  / Circumcision: planning-baby in NICU/ Feeding: breastpumping   Objective: VS:  Filed Vitals:   01/30/13 0400 01/30/13 1000 01/30/13 2200 01/31/13 0530  BP: 118/80 129/87 125/84 129/82  Pulse: 81 92 76 73  Temp: 98.6 F (37 C) 98.3 F (36.8 C) 98.9 F (37.2 C) 98.1 F (36.7 C)  TempSrc: Oral Oral Oral Oral  Resp: 18 20 20 18   Height:      Weight:      SpO2: 96% 97%      I&O: Intake/Output     10/11 0701 - 10/12 0700 10/12 0701 - 10/13 0700   P.O.     I.V. (mL/kg)     IV Piggyback     Total Intake(mL/kg)     Urine (mL/kg/hr) 1250 (0.6)    Blood     Total Output 1250     Net -1250            LABS:  Recent Labs  01/30/13 0500 01/31/13 0551  WBC 4.4 3.8*  HGB 8.8* 8.5*  HCT 26.4* 25.7*  PLT 55* 68*    Blood type: --/--/A POS (10/09 1000) Rubella: Immune (04/17 1447)     Physical Exam:  General: alert and cooperative CV: Regular rate and rhythm Resp: CTA bilaterally Abdomen: soft, nontender, normal bowel sounds Uterine Fundus: firm, below umbilicus, nontender Incision: Covered with Tegaderm and honeycomb dressing; well approximated, moderate dark red drainage. Lochia: minimal Ext: extremities normal, atraumatic, no cyanosis or edema and Homans sign is negative, no sign of DVT    Assessment/: POD # 2/ G5P1032/ S/P C/Section d/t repeat, ITP, A2DM ITP, delivered Anemia Doing well  Plan: Continue routine post op orders Platelets stable Anemia stable Anticipate discharge home in the am F/u with Dr. Cyndie Chime    Signed: Donette Larry, Dorris Carnes, MSN, CNM 01/31/2013, 8:09 AM

## 2013-02-01 ENCOUNTER — Encounter (HOSPITAL_COMMUNITY): Payer: Self-pay | Admitting: Obstetrics and Gynecology

## 2013-02-01 DIAGNOSIS — D62 Acute posthemorrhagic anemia: Secondary | ICD-10-CM | POA: Diagnosis not present

## 2013-02-01 LAB — TYPE AND SCREEN
ABO/RH(D): A POS
PT AG Type: NEGATIVE
Unit division: 0
Unit division: 0

## 2013-02-01 LAB — CBC
HCT: 25.7 % — ABNORMAL LOW (ref 36.0–46.0)
Hemoglobin: 8.5 g/dL — ABNORMAL LOW (ref 12.0–15.0)
MCHC: 33.1 g/dL (ref 30.0–36.0)
MCV: 92.4 fL (ref 78.0–100.0)
Platelets: 76 10*3/uL — ABNORMAL LOW (ref 150–400)
RBC: 2.78 MIL/uL — ABNORMAL LOW (ref 3.87–5.11)
WBC: 3.5 10*3/uL — ABNORMAL LOW (ref 4.0–10.5)

## 2013-02-01 MED ORDER — POLYSACCHARIDE IRON COMPLEX 150 MG PO CAPS
150.0000 mg | ORAL_CAPSULE | Freq: Every day | ORAL | Status: DC
  Filled 2013-02-01 (×2): qty 1

## 2013-02-01 MED ORDER — MAGNESIUM OXIDE 400 (241.3 MG) MG PO TABS
400.0000 mg | ORAL_TABLET | Freq: Every day | ORAL | Status: DC
  Filled 2013-02-01 (×2): qty 1

## 2013-02-01 MED ORDER — MAGNESIUM OXIDE 400 (241.3 MG) MG PO TABS: 400.0000 mg | ORAL_TABLET | Freq: Every day | ORAL | Status: DC

## 2013-02-01 MED ORDER — POLYSACCHARIDE IRON COMPLEX 150 MG PO CAPS: 150.0000 mg | ORAL_CAPSULE | Freq: Every day | ORAL | Status: DC

## 2013-02-01 MED ORDER — OXYCODONE-ACETAMINOPHEN 5-325 MG PO TABS: 1.0000 | ORAL_TABLET | ORAL | Status: DC | PRN

## 2013-02-01 NOTE — Discharge Summary (Signed)
POSTOPERATIVE DISCHARGE SUMMARY:  Patient ID: [name] MRN: [id number] DOB/AGE: [birth year] 37 y.o.  Admit date: 01/29/2013 Admission Diagnoses: 38 weeks / ITP s/p IVG with persistent low platelets / GDM-A2  Discharge date:  02/01/2013 Discharge Diagnoses: POD 3 s/p cesarean section / acute blood loss anemia / ITP - stable  Prenatal history: [id number]   EDC : 02/12/2013, by Last Menstrual Period  Prenatal care at [facility]  Primary provider : Taavon Prenatal course complicated by AMA / hx GDM and low platelets / ITP / GDM-A2  Prenatal Labs: ABO, Rh: --/--/A POS (10/09 1000)  Antibody: NEG (10/09 1000) Rubella: Immune (04/17 1447)  RPR: NON REACTIVE (10/09 1000)  HBsAg: Negative (04/17 1447)  HIV: Non-reactive (04/17 1447)  GTT : abnormal - c/w GDM   Medical / Surgical History :  Past medical history:  Past Medical History  Diagnosis Date  . 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 Pregnancy, supervision for, high-risk 12/18/2012  . Diabetes mellitus without complication     gest  . Neuromuscular disorder     bells palsey 2002    Past surgical history:  Past Surgical History  Procedure Laterality Date  . Wisdom tooth extraction    . Cesarean section  2007  . Mirena iud removal  06/2010  . Dilation and evacuation  05/07/2011    Procedure: DILATATION AND EVACUATION;  Surgeon: Lenoard Aden, MD;  Location: [facility];  Service: Gynecology;  Laterality: N/A;  . Dilation and evacuation  02/14/2012    Procedure: DILATATION AND EVACUATION;  Surgeon: Lenoard Aden, MD;  Location: [facility];  Service: Gynecology;  Laterality: N/A;  chromosome studies    Family History:  Family History  Problem Relation Age of Onset  . Cancer Other   . Diabetes Other   . Heart attack Other     Social History:  reports that she quit smoking about 3 years ago. Her smoking use included Cigarettes. She has a 1.25 pack-year smoking history. She has never used  smokeless tobacco. She reports that she drinks alcohol. She reports that she does not use illicit drugs.  Allergies: Review of patient's allergies indicates no known allergies.   Current Medications at time of admission:  Prior to Admission medications   Medication Sig Start Date End Date Taking? Authorizing Provider  acetaminophen (TYLENOL) 500 MG tablet Take 500 mg by mouth every 6 (six) hours as needed for pain.   Yes Historical Provider, MD  Prenatal Vit-Fe Fumarate-FA (PRENATAL MULTIVITAMIN) TABS Take 1 tablet by mouth daily.   Yes Historical Provider, MD          Procedures: Cesarean section delivery on 01/29/2013 with delivery of  female newborn by Dr Billy Coast   See operative report for further details APGAR (1 MIN): 9   APGAR (5 MINS): 9    Postoperative / postpartum course:  Uncomplicated with discharge on POD 3   Physical Exam:   VSS: Temp:  [97.9 F (36.6 C)-98.3 F (36.8 C)] 97.9 F (36.6 C) (10/13 0556) Pulse Rate:  [77-86] 77 (10/13 0556) Resp:  [18-20] 20 (10/13 0556) BP: (129-130)/(83-98) 129/98 mmHg (10/13 0556)  LABS:  Recent Labs  01/31/13 0551 02/01/13 0600  WBC 3.8* 3.5*  HGB 8.5* 8.5*  PLT 68* 76*    General: pleasant /pale / ambulatory Heart: RRR Lungs: clear  Abdomen: soft and non-tender / non-distended / active BS  Extremities: trace edema / negative Homans  Dressing: intact honeycomb dressing Incision:  approximated with  staples / no erythema / no ecchymosis / no drainage  Discharge Instructions:  Discharged Condition: stable  Activity: pelvic rest and postoperative restrictions x 2   Diet: low carb  Medications:    Medication List    STOP taking these medications       glyBURIDE 2.5 MG tablet  Commonly known as:  DIABETA      TAKE these medications       acetaminophen 500 MG tablet  Commonly known as:  TYLENOL  Take 500 mg by mouth every 6 (six) hours as needed for pain.     oxyCODONE-acetaminophen 5-325 MG per tablet   Commonly known as:  PERCOCET/ROXICET  Take 1-2 tablets by mouth every 4 (four) hours as needed.     prenatal multivitamin Tabs tablet  Take 1 tablet by mouth daily.           Niferex 150 mg daily x 6 weeks      Magnesium 200-400 mg daily with iron to prevent constipation   Wound Care: keep clean and dry / office visit this week for staple removal Postpartum Instructions: [facility] discharge booklet - instructions reviewed  Discharge to: Home  Follow up :  [facility] in 3-4 days for interval visit with midwife for staple removal & lab work Chief Technology Officer in 6 weeks for routine postpartum visit with Taavon Appointment with hematology as scheduled     Sugar test 6-12 weeks postpartum             Signed: Marlinda Mike CNM, MSN, [facility] 02/01/2013, 9:14 AM

## 2013-02-01 NOTE — Lactation Note (Addendum)
This note was copied from the chart of Abigail Rameen Gohlke. Lactation Consultation Note      Follow up consult with this mom of a term baby, In NICU with hypoglycemia. Mom is now 72 hours post partum, and is being discharged to home today. Mom 's milk is transitioning in - she is expressing at least 30 mls of milk . She has a scabbed nipple stripe on her right nipple, and her left is pink and tender. Mom has been using lanolin. I advised her to instead use EBM and gave her comfort gels and instructed her in their use. I also increased mom to size 30 flanges. I told mom to let me know when and if she needs help with latching her baby in the  NICU.   Patient Name: [name] [id number] Date: 02/01/2013 Reason for consult: Follow-up assessment;NICU baby   Maternal Data    Feeding Feeding Type: Bottle Fed - Formula Nipple Type: Regular Length of feed: 15 min  LATCH Score/Interventions                      Lactation Tools Discussed/Used WIC Program: No Pump Review: Setup, frequency, and cleaning Initiated by:: bedside Rn   Consult Status Consult Status: PRN Follow-up type:  (in NICU)    Alfred Levins 02/01/2013, 10:53 AM

## 2013-02-01 NOTE — Progress Notes (Signed)
POSTOPERATIVE D AY # 3 S/P repeat LTCS    S:         Reports feeling good; a little sore              Tolerating po intake / no nausea / no vomiting / passing flatus / no BM             Bleeding is light             Pain controlled withpercocet             Up ad lib / ambulatory/ voiding QS  Newborn breast pumping while baby in NICU  / Circumcision planned   O:  VS: BP 129/98  Pulse 77  Temp(Src) 97.9 F (36.6 C) (Oral)  Resp 20  Ht 5\' 3"  (1.6 m)  Wt 85.73 kg (189 lb)  BMI 33.49 kg/m2  SpO2 97%  LMP 05/08/2012   LABS:               Recent Labs  01/31/13 0551 02/01/13 0600  WBC 3.8* 3.5*  HGB 8.5* 8.5*  PLT 68* 76*               Bloodtype: --/--/A POS (10/09 1000)  Rubella: Immune (04/17 1447)                                 Net I/O: +895.8, but output not recorded 10/12-10/13                       Physical Exam:             Alert and Oriented X3  Lungs: Clear and unlabored  Heart: regular rate and rhythm / no mumurs  Abdomen: soft, non-tender, non-distended, +BS             Fundus: firm, non-tender, U/U             Dressing Tegaderm and honeycomb dressing dry and intact              Incision:  approximated with staples / no erythema / no ecchymosis / no drainage; scant amt of dried blood at staples  Perineum: not swollen, intact  Lochia: scant-light rubra, no clots  Extremities: no edema, no calf pain or tenderness, negative Homans, no signs of DVT  A/P:         1.) POD # 3 S/P repeat LTCS             Routine postoperative care   D/C home today   See in office Thurs or Fri for staples removal  2.) IDA--H/H stable  Recheck CBC in office the day of staples removal  Start Niferex 150 mg and magnesium 400 mg q day  CBC and ferratin level in 6 wks  3.) ITP--platelet count improving  Recheck CBC the day of staples removal and at 6 week PP visit   Continue to hold motrin.  May use percocet and tylenol for pain management.  F/u w/ hematology scheduled  4.)  Class A2 GDM--delivered  Continue low-carb diet  Encourage BG checks at home  OGTT in 6 wks            Cyndee Brightly [facility] 02/01/2013, 8:50 AM

## 2013-02-02 ENCOUNTER — Other Ambulatory Visit: Payer: BC Managed Care – PPO | Admitting: Lab

## 2013-02-02 ENCOUNTER — Ambulatory Visit: Payer: BC Managed Care – PPO | Admitting: Oncology

## 2013-02-11 ENCOUNTER — Other Ambulatory Visit: Payer: Self-pay | Admitting: Oncology

## 2013-02-11 ENCOUNTER — Telehealth: Payer: Self-pay | Admitting: Oncology

## 2013-02-11 DIAGNOSIS — O0990 Supervision of high risk pregnancy, unspecified, unspecified trimester: Secondary | ICD-10-CM

## 2013-02-11 DIAGNOSIS — D693 Immune thrombocytopenic purpura: Secondary | ICD-10-CM

## 2013-02-11 NOTE — Telephone Encounter (Signed)
lvm for pt regarding to OCT appt..... °

## 2013-02-14 NOTE — Progress Notes (Signed)
Seen and agree - discharged by CNM.  Marlinda Mike CNM MSN [facility]

## 2013-02-17 ENCOUNTER — Ambulatory Visit: Payer: BC Managed Care – PPO | Admitting: Nurse Practitioner

## 2013-02-17 ENCOUNTER — Other Ambulatory Visit: Payer: BC Managed Care – PPO | Admitting: Lab

## 2013-02-25 ENCOUNTER — Other Ambulatory Visit: Payer: Self-pay

## 2013-06-19 ENCOUNTER — Encounter: Payer: Self-pay | Admitting: Oncology

## 2014-02-21 ENCOUNTER — Encounter (HOSPITAL_COMMUNITY): Payer: Self-pay | Admitting: Obstetrics and Gynecology

## 2015-02-20 ENCOUNTER — Ambulatory Visit (INDEPENDENT_AMBULATORY_CARE_PROVIDER_SITE_OTHER): Payer: BLUE CROSS/BLUE SHIELD | Admitting: Physician Assistant

## 2015-02-20 ENCOUNTER — Ambulatory Visit (INDEPENDENT_AMBULATORY_CARE_PROVIDER_SITE_OTHER): Payer: BLUE CROSS/BLUE SHIELD

## 2015-02-20 VITALS — BP 150/90 | HR 110 | Temp 98.7°F | Resp 16 | Ht 64.0 in | Wt 166.0 lb

## 2015-02-20 DIAGNOSIS — R062 Wheezing: Secondary | ICD-10-CM | POA: Diagnosis not present

## 2015-02-20 DIAGNOSIS — R8299 Other abnormal findings in urine: Secondary | ICD-10-CM

## 2015-02-20 DIAGNOSIS — N3000 Acute cystitis without hematuria: Secondary | ICD-10-CM | POA: Diagnosis not present

## 2015-02-20 DIAGNOSIS — R05 Cough: Secondary | ICD-10-CM | POA: Diagnosis not present

## 2015-02-20 DIAGNOSIS — R059 Cough, unspecified: Secondary | ICD-10-CM

## 2015-02-20 DIAGNOSIS — H6122 Impacted cerumen, left ear: Secondary | ICD-10-CM | POA: Diagnosis not present

## 2015-02-20 DIAGNOSIS — R8290 Unspecified abnormal findings in urine: Secondary | ICD-10-CM

## 2015-02-20 LAB — POCT CBC
Granulocyte percent: 68.5 %G (ref 37–80)
HEMATOCRIT: 42.9 % (ref 37.7–47.9)
HEMOGLOBIN: 15.3 g/dL (ref 12.2–16.2)
Lymph, poc: 2.3 (ref 0.6–3.4)
MCH: 37.5 pg — AB (ref 27–31.2)
MCHC: 35.7 g/dL — AB (ref 31.8–35.4)
MCV: 104.9 fL — AB (ref 80–97)
MID (cbc): 0.5 (ref 0–0.9)
MPV: 7.2 fL (ref 0–99.8)
POC GRANULOCYTE: 6.2 (ref 2–6.9)
POC LYMPH PERCENT: 25.7 %L (ref 10–50)
POC MID %: 5.8 %M (ref 0–12)
Platelet Count, POC: 131 10*3/uL — AB (ref 142–424)
RBC: 4.09 M/uL (ref 4.04–5.48)
RDW, POC: 13.7 %
WBC: 9 10*3/uL (ref 4.6–10.2)

## 2015-02-20 LAB — POCT URINALYSIS DIP (MANUAL ENTRY)
BILIRUBIN UA: NEGATIVE
BILIRUBIN UA: NEGATIVE
Glucose, UA: NEGATIVE
Leukocytes, UA: NEGATIVE
Nitrite, UA: POSITIVE — AB
PH UA: 7
Protein Ur, POC: NEGATIVE
RBC UA: NEGATIVE
Spec Grav, UA: 1.02
Urobilinogen, UA: 1

## 2015-02-20 LAB — POC MICROSCOPIC URINALYSIS (UMFC): Mucus: ABSENT

## 2015-02-20 MED ORDER — PREDNISONE 20 MG PO TABS: ORAL_TABLET | ORAL | Status: DC

## 2015-02-20 MED ORDER — NITROFURANTOIN MONOHYD MACRO 100 MG PO CAPS: 100.0000 mg | ORAL_CAPSULE | Freq: Two times a day (BID) | ORAL | Status: DC

## 2015-02-20 MED ORDER — ALBUTEROL SULFATE (2.5 MG/3ML) 0.083% IN NEBU
2.5000 mg | INHALATION_SOLUTION | Freq: Once | RESPIRATORY_TRACT | Status: AC
  Administered 2015-02-20: 2.5 mg via RESPIRATORY_TRACT

## 2015-02-20 MED ORDER — IPRATROPIUM BROMIDE 0.02 % IN SOLN
0.5000 mg | Freq: Once | RESPIRATORY_TRACT | Status: AC
  Administered 2015-02-20: 0.5 mg via RESPIRATORY_TRACT

## 2015-02-20 NOTE — Progress Notes (Signed)
02/20/2015 at 3:41 PM  [name] / DOB: [birth year] / MRN: [id number]  The patient has Immune thrombocytopenic purpura (HCC); Pregnancy, supervision for, high-risk; and Postpartum care following cesarean delivery (10/10) on her problem list.  SUBJECTIVE  [name] is a 39 y.o. female who complains of cough and rhinorrhea that started to weeks ago.  Reports she though it was a cold and tried to weather the symptoms but feels that she is not getting better.  Reports that she has tried several OTC medications without improvement.  Denies a history of asthma.  Has a 2.5 pack year history of smoking.    Complains of cloudy and foul smelling urine that started 2 days ago.  Denies dysuria, frequency, urgency.  Denies fever, chills, nausea and rigor.  Denies back pain.    She  has a past medical history of Immune thrombocytopenic purpura (HCC) (12/18/2012); Pregnancy, supervision for, high-risk (12/18/2012); Diabetes mellitus without complication (HCC); Neuromuscular disorder (HCC); and Allergy.    Medications reviewed and updated by myself where necessary, and exist elsewhere in the encounter.   [name] has No Known Allergies. She  reports that she quit smoking about 5 years ago. Her smoking use included Cigarettes. She has a 1.25 pack-year smoking history. She has never used smokeless tobacco. She reports that she drinks alcohol. She reports that she does not use illicit drugs. She  reports that she currently engages in sexual activity. The patient  has past surgical history that includes Wisdom tooth extraction; Cesarean section (2007); Mirena IUD removal (06/2010); Dilation and evacuation (05/07/2011); Dilation and evacuation (02/14/2012); and Cesarean section (N/A, 01/29/2013).  Her family history includes Cancer in her father and maternal grandmother; Heart disease in her brother, father, maternal grandfather, and paternal grandfather.  Review of Systems  Constitutional: Negative for  fever and chills.  Respiratory: Positive for cough. Negative for hemoptysis, sputum production and shortness of breath.   Cardiovascular: Negative for chest pain.  Gastrointestinal: Negative for nausea and abdominal pain.  Genitourinary: Negative.   Skin: Negative for rash.  Neurological: Negative for dizziness and headaches.    OBJECTIVE  Her  height is  (1.626 m) and weight is 166 lb (75.297 kg). Her oral temperature is 98.7 F (37.1 C). Her blood pressure is 150/90 and her pulse is 110. Her respiration is 16 and oxygen saturation is 98%.  The patient's body mass index is 28.48 kg/(m^2).  Physical Exam  Nursing note reviewed. Constitutional: She is oriented to person, place, and time. She appears well-developed and well-nourished. No distress.  Eyes: Pupils are equal, round, and reactive to light.  Cardiovascular: Regular rhythm, normal heart sounds and intact distal pulses.  Exam reveals no gallop and no friction rub.   No murmur heard. Respiratory: Effort normal. No respiratory distress. She has wheezes. She has no rales. She exhibits no tenderness.  GI: She exhibits no distension.  Musculoskeletal: Normal range of motion.  Neurological: She is alert and oriented to person, place, and time.  Skin: Skin is warm and dry. She is not diaphoretic.  Psychiatric: She has a normal mood and affect. Her behavior is normal. Judgment and thought content normal.    [facility] reading (PRIMARY) by PA Clark: Stat read please comment.    Results for orders placed or performed in visit on 02/20/15 (from the past 24 hour(s))  POCT CBC     Status: Abnormal   Collection Time: 02/20/15  2:15 PM  Result Value Ref Range  WBC 9.0 4.6 - 10.2 K/uL   Lymph, poc 2.3 0.6 - 3.4   POC LYMPH PERCENT 25.7 10 - 50 %L   MID (cbc) 0.5 0 - 0.9   POC MID % 5.8 0 - 12 %M   POC Granulocyte 6.2 2 - 6.9   Granulocyte percent 68.5 37 - 80 %G   RBC 4.09 4.04 - 5.48 M/uL   Hemoglobin 15.3 12.2 - 16.2 g/dL   HCT,  POC 11.942.9 14.737.7 - 47.9 %   MCV 104.9 (A) 80 - 97 fL   MCH, POC 37.5 (A) 27 - 31.2 pg   MCHC 35.7 (A) 31.8 - 35.4 g/dL   RDW, POC 82.913.7 %   Platelet Count, POC 131 (A) 142 - 424 K/uL   MPV 7.2 0 - 99.8 fL  POCT urinalysis dipstick     Status: Abnormal   Collection Time: 02/20/15  3:32 PM  Result Value Ref Range   Color, UA yellow yellow   Clarity, UA cloudy (A) clear   Glucose, UA negative negative   Bilirubin, UA negative negative   Ketones, POC UA negative negative   Spec Grav, UA 1.020    Blood, UA negative negative   pH, UA 7.0    Protein Ur, POC negative negative   Urobilinogen, UA 1.0    Nitrite, UA Positive (A) Negative   Leukocytes, UA Negative Negative  POCT Microscopic Urinalysis ([facility])     Status: Abnormal   Collection Time: 02/20/15  3:32 PM  Result Value Ref Range   WBC,UR,HPF,POC Few (A) None WBC/hpf   RBC,UR,HPF,POC None None RBC/hpf   Bacteria Many (A) None, Too numerous to count   Mucus Absent Absent   Epithelial Cells, UR Per Microscopy Few (A) None, Too numerous to count cells/hpf    ASSESSMENT & PLAN  [name] was seen today for cough and smell of urine.  Diagnoses and all orders for this visit:  Cough -     POCT CBC -     DG Chest 2 View; Future -     predniSONE (DELTASONE) 20 MG tablet; Take 3 PO QAM x3days, 2 PO QAM x3days, 1 PO QAM x3days  Wheezing -     albuterol (PROVENTIL) (2.5 MG/3ML) 0.083% nebulizer solution 2.5 mg; Take 3 mLs (2.5 mg total) by nebulization once. -     ipratropium (ATROVENT) nebulizer solution 0.5 mg; Take 2.5 mLs (0.5 mg total) by nebulization once. -     predniSONE (DELTASONE) 20 MG tablet; Take 3 PO QAM x3days, 2 PO QAM x3days, 1 PO QAM x3days  Cloudy urine -     POCT urinalysis dipstick -     POCT Microscopic Urinalysis ([facility]) -     Urine culture  Cerumen impaction, left -     Ear wax removal  Acute cystitis without hematuria -     Urine culture -     nitrofurantoin, macrocrystal-monohydrate, (MACROBID) 100 MG  capsule; Take 1 capsule (100 mg total) by mouth 2 (two) times daily.    The patient was advised to call or come back to clinic if she does not see an improvement in symptoms, or worsens with the above plan.   [name] BostonMichael Clark, MHS, PA-C [facility] 02/20/2015 3:41 PM

## 2015-02-22 LAB — URINE CULTURE

## 2015-02-23 ENCOUNTER — Encounter: Payer: Self-pay | Admitting: Physician Assistant

## 2015-03-10 NOTE — Progress Notes (Signed)
  Medical screening examination/treatment/procedure(s) were performed by non-physician practitioner and as supervising physician I was immediately available for consultation/collaboration.     

## 2015-06-21 ENCOUNTER — Ambulatory Visit (INDEPENDENT_AMBULATORY_CARE_PROVIDER_SITE_OTHER): Payer: BLUE CROSS/BLUE SHIELD

## 2015-06-21 ENCOUNTER — Ambulatory Visit (INDEPENDENT_AMBULATORY_CARE_PROVIDER_SITE_OTHER): Payer: BLUE CROSS/BLUE SHIELD | Admitting: Physician Assistant

## 2015-06-21 VITALS — BP 150/110 | HR 115 | Temp 97.8°F | Resp 20 | Ht 64.57 in | Wt 163.2 lb

## 2015-06-21 DIAGNOSIS — R062 Wheezing: Secondary | ICD-10-CM | POA: Diagnosis not present

## 2015-06-21 DIAGNOSIS — R030 Elevated blood-pressure reading, without diagnosis of hypertension: Secondary | ICD-10-CM

## 2015-06-21 DIAGNOSIS — J309 Allergic rhinitis, unspecified: Secondary | ICD-10-CM

## 2015-06-21 DIAGNOSIS — J4530 Mild persistent asthma, uncomplicated: Secondary | ICD-10-CM

## 2015-06-21 DIAGNOSIS — IMO0001 Reserved for inherently not codable concepts without codable children: Secondary | ICD-10-CM

## 2015-06-21 DIAGNOSIS — I10 Essential (primary) hypertension: Secondary | ICD-10-CM

## 2015-06-21 MED ORDER — ALBUTEROL SULFATE HFA 108 (90 BASE) MCG/ACT IN AERS: 2.0000 | INHALATION_SPRAY | RESPIRATORY_TRACT | Status: AC | PRN

## 2015-06-21 MED ORDER — BECLOMETHASONE DIPROPIONATE 40 MCG/ACT IN AERS: 1.0000 | INHALATION_SPRAY | Freq: Two times a day (BID) | RESPIRATORY_TRACT | Status: AC

## 2015-06-21 NOTE — Patient Instructions (Addendum)
Because you received an x-ray today, you will receive an invoice from [facility]. Please contact [facility] at [phone] with questions or concerns regarding your invoice. Our billing staff will not be able to assist you with those questions.  Use qvar 1 puff twice a day. Rinse your mouth afterwards. Use Albuterol for break through symptoms. Start taking an allergy pill like zyrtec daily. This can make you sleepy, if that happens take at night.  Exercise - walk 30 minutes most days of the week Diet - limit processed food. Eat vegetables, fruit, lean meat. Limit your red meat. Return in 3 months for follow up blood pressure.

## 2015-06-21 NOTE — Progress Notes (Signed)
[facility] [address], [place] [address] [phone]  Date:  06/21/2015   Name:  [name]   DOB:  01-20-1976   MRN:  [id number]  PCP:  No PCP Per Patient    Chief Complaint: Wheezing   History of Present Illness:  This is a 40 y.o. female who is presenting with 4 months of wheezing. She was seen here 4 months ago with cough, rhinorrhea and wheezing. Was seen by Deliah Boston, PA-C. Treated with duoneb treatment in office and prednisone taper. She states she got a little better after the prednisone taper but in general has stayed the same since. She finds she wheezes most days. Happens more during the day than at night. Happens at rest more than with exertion, although she admits that she does not exert too much. She does not have a history of asthma or allergies. She has been fairly congested over the past 4 months. occ cough. Denies fever or chills. Does not feel sob. Former smoker, quit 2009 or 2010. Smoked for 5 years.  No new exposures. Not living in a new house. No new pets. No new mattresses or pillows. She has not been taking anything for her symptoms. She does state that she has had more problems with acid reflux lately -- zantac prn helps.  BP is elevated today to 150/110. Looking back over the past 3 years, BP has been consistently elevated, although this does seem to be the highest. She feels her BP was the best when she was pregnant 2.5 years ago. She admits that she does not eat very healthy and does not exercise.  States she is 100% she is not pregnant. She is on OCP and is on her period currently. Review of Systems:  Review of Systems See HPI  Patient Active Problem List   Diagnosis Date Noted  . Postpartum care following cesarean delivery (10/10) 01/29/2013  . Immune thrombocytopenic purpura (HCC) 12/18/2012  . Pregnancy, supervision for, high-risk 12/18/2012    Prior to Admission medications   Not on File    No Known  Allergies  Past Surgical History  Procedure Laterality Date  . Wisdom tooth extraction    . Cesarean section  2007  . Mirena iud removal  06/2010  . Dilation and evacuation  05/07/2011    Procedure: DILATATION AND EVACUATION;  Surgeon: Lenoard Aden, MD;  Location: [facility];  Service: Gynecology;  Laterality: N/A;  . Dilation and evacuation  02/14/2012    Procedure: DILATATION AND EVACUATION;  Surgeon: Lenoard Aden, MD;  Location: [facility];  Service: Gynecology;  Laterality: N/A;  chromosome studies  . Cesarean section N/A 01/29/2013    Procedure: Repeat CESAREAN SECTION;  Surgeon: Lenoard Aden, MD;  Location: [facility];  Service: Obstetrics;  Laterality: N/A;    Social History  Substance Use Topics  . Smoking status: Former Smoker -- 0.25 packs/day for 5 years    Types: Cigarettes    Quit date: 04/22/2009  . Smokeless tobacco: Never Used  . Alcohol Use: Yes     Comment: none since found out she was pregnant    Family History  Problem Relation Age of Onset  . Heart disease Father   . Cancer Father   . Heart disease Brother   . Cancer Maternal Grandmother   . Heart disease Maternal Grandfather   . Heart disease Paternal Grandfather     Medication list has been reviewed and updated.  Physical Examination:  Physical Exam  Constitutional: She is oriented to person, place, and time. She appears well-developed and well-nourished. No distress.  HENT:  Head: Normocephalic and atraumatic.  Right Ear: Hearing, tympanic membrane, external ear and ear canal normal.  Left Ear: Hearing, tympanic membrane, external ear and ear canal normal.  Nose: Nose normal.  Mouth/Throat: Uvula is midline, oropharynx is clear and moist and mucous membranes are normal.  Eyes: Conjunctivae and lids are normal. Right eye exhibits no discharge. Left eye exhibits no discharge. No scleral icterus.  Cardiovascular: Normal rate, regular rhythm, normal heart sounds and normal pulses.   No murmur  heard. Pulmonary/Chest: Effort normal. No respiratory distress. She has no decreased breath sounds. She has wheezes (moderate, throughout). She has no rhonchi. She has no rales.  Abdominal: Soft. Normal appearance. There is no tenderness.  No epigastric tenderness  Musculoskeletal: Normal range of motion.  Lymphadenopathy:       Head (right side): No submental, no submandibular and no tonsillar adenopathy present.       Head (left side): No submental, no submandibular and no tonsillar adenopathy present.    She has no cervical adenopathy.  Neurological: She is alert and oriented to person, place, and time.  Skin: Skin is warm, dry and intact. No lesion and no rash noted.  Psychiatric: She has a normal mood and affect. Her speech is normal and behavior is normal. Thought content normal.   BP 150/110 mmHg  Pulse 115  Temp(Src) 97.8 F (36.6 C) (Oral)  Resp 20  Ht 5' 4.57" (1.64 m)  Wt 163 lb 3.2 oz (74.027 kg)  BMI 27.52 kg/m2  SpO2 99%  LMP 06/19/2015 Dg Chest 2 View  06/21/2015  CLINICAL DATA:  Wheezing for 4 months EXAM: CHEST  2 VIEW COMPARISON:  Chest x-ray of 02/20/2015 FINDINGS: No active infiltrate or effusion is seen mediastinal and hilar contours are unremarkable. The heart is within normal limits in size. No bony abnormality is seen. IMPRESSION: No active cardiopulmonary disease. Electronically Signed   By: Dwyane Dee M.D.   On: 06/21/2015 16:16    Spirometry unsuccessful - pt unable to perform what the machine was asking for and she requested to stop.  Assessment and Plan:  1. Asthma, mild persistent, uncomplicated 2. Wheezing  3. Allergic rhinitis CXR negative. Suspect new onset env allergies, allergen unknown, and allergy induced asthma. She will start zyrtec daily. She will start qvar BID and albuterol prn breakthrough wheezing. May be able to come off qvar in a few months in allergies controlled with zyrtec. Was unable to do spirometry or peak flow today - pt requested  to stop spirometry and did not want to do breathing treatment as made her very jittery when she did before. Return in 3 months for follow up. - beclomethasone (QVAR) 40 MCG/ACT inhaler; Inhale 1 puff into the lungs 2 (two) times daily.  Dispense: 1 Inhaler; Refill: 12 - albuterol (PROVENTIL HFA;VENTOLIN HFA) 108 (90 Base) MCG/ACT inhaler; Inhale 2 puffs into the lungs every 4 (four) hours as needed for wheezing or shortness of breath (cough, shortness of breath or wheezing.).  Dispense: 1 Inhaler; Refill: 3 - DG Chest 2 View; Future - PFT PULM FXN SPIROMETRY (94010)  4. Essential hypertension 5. Elevated BP I suggested she start medication for BP today. She declined. She wants to try to lower her bp with diet and exercise. Return in 3 months for recheck. If remains elevated, she states she will be agreeable to starting on medication at  that time.   [place] Brackett, MHS [facility]  06/22/2015

## 2015-06-22 DIAGNOSIS — I10 Essential (primary) hypertension: Secondary | ICD-10-CM | POA: Insufficient documentation

## 2015-06-22 DIAGNOSIS — J4530 Mild persistent asthma, uncomplicated: Secondary | ICD-10-CM | POA: Insufficient documentation

## 2015-07-03 ENCOUNTER — Encounter: Payer: Self-pay | Admitting: Physician Assistant

## 2016-06-01 IMAGING — CR DG CHEST 2V
2 series · 2 of 2 positions shown · non-contrast
Comparison: CT chest 07/24/2009

CLINICAL DATA: Cough, congestion

EXAM:
CHEST  2 VIEW

[PA]
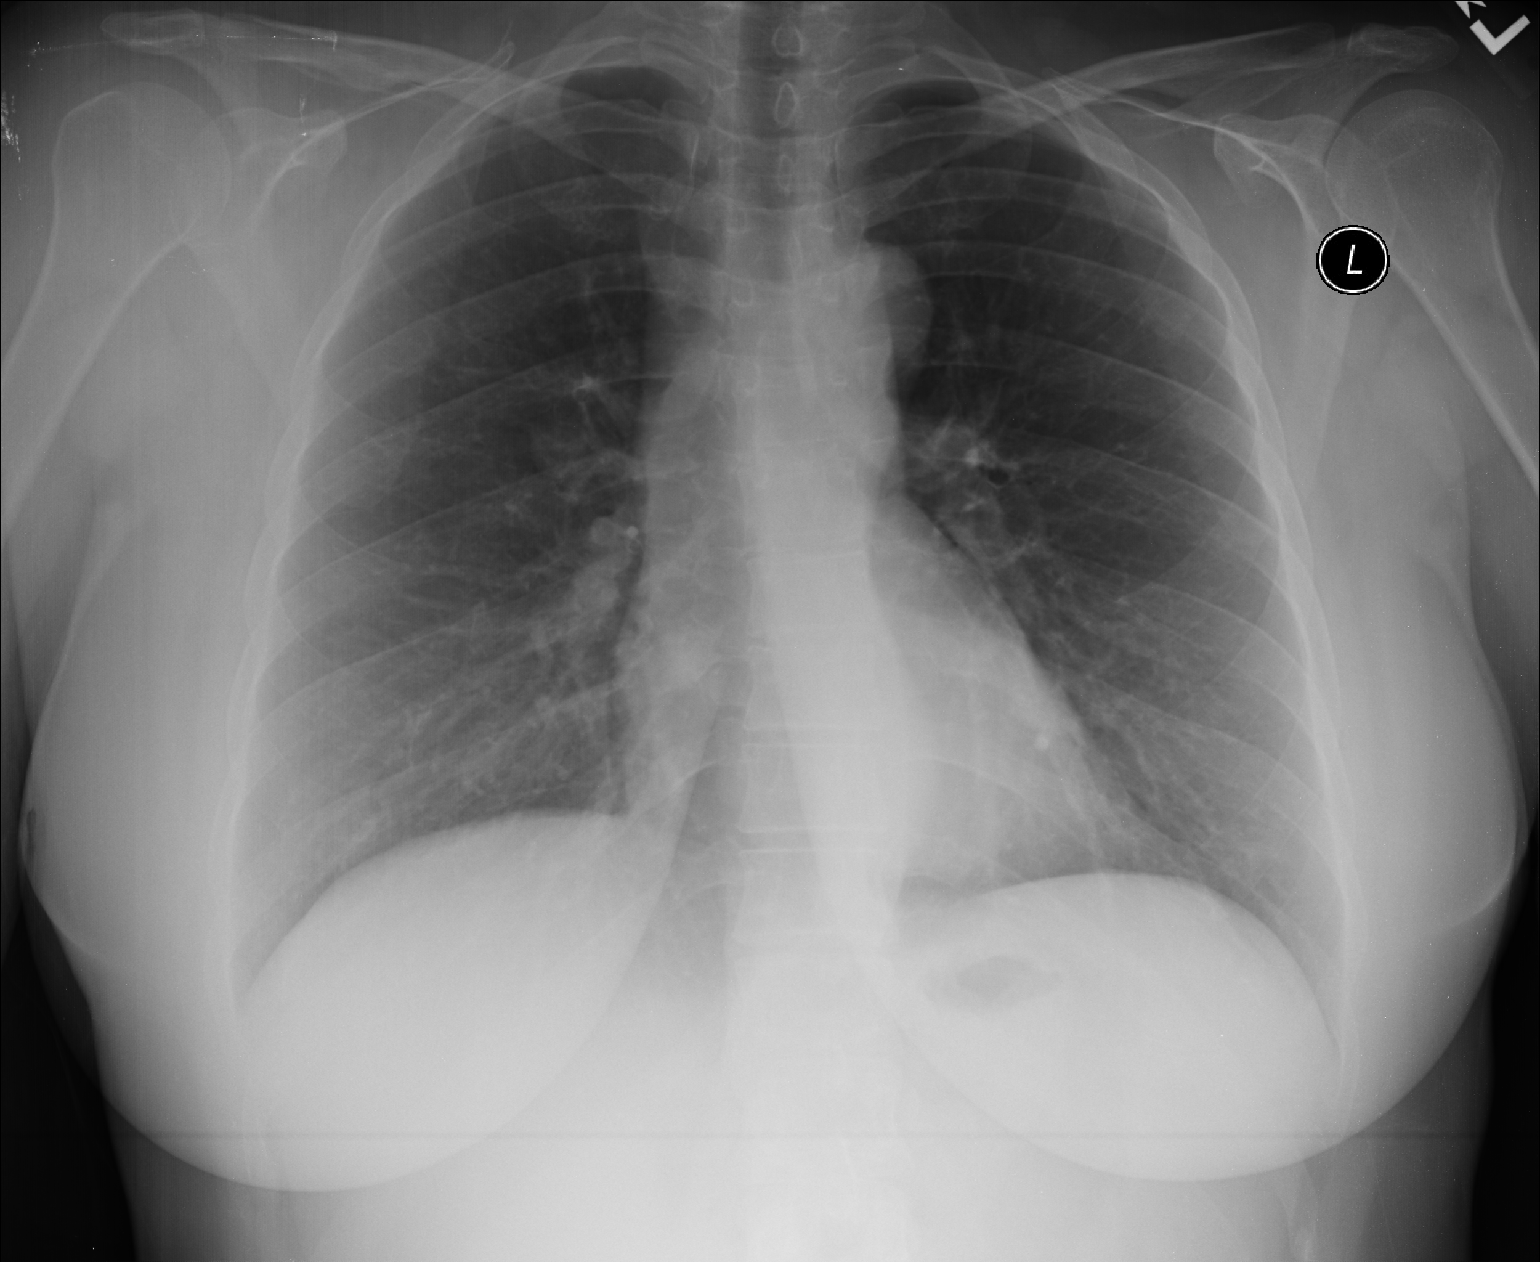

[lateral]
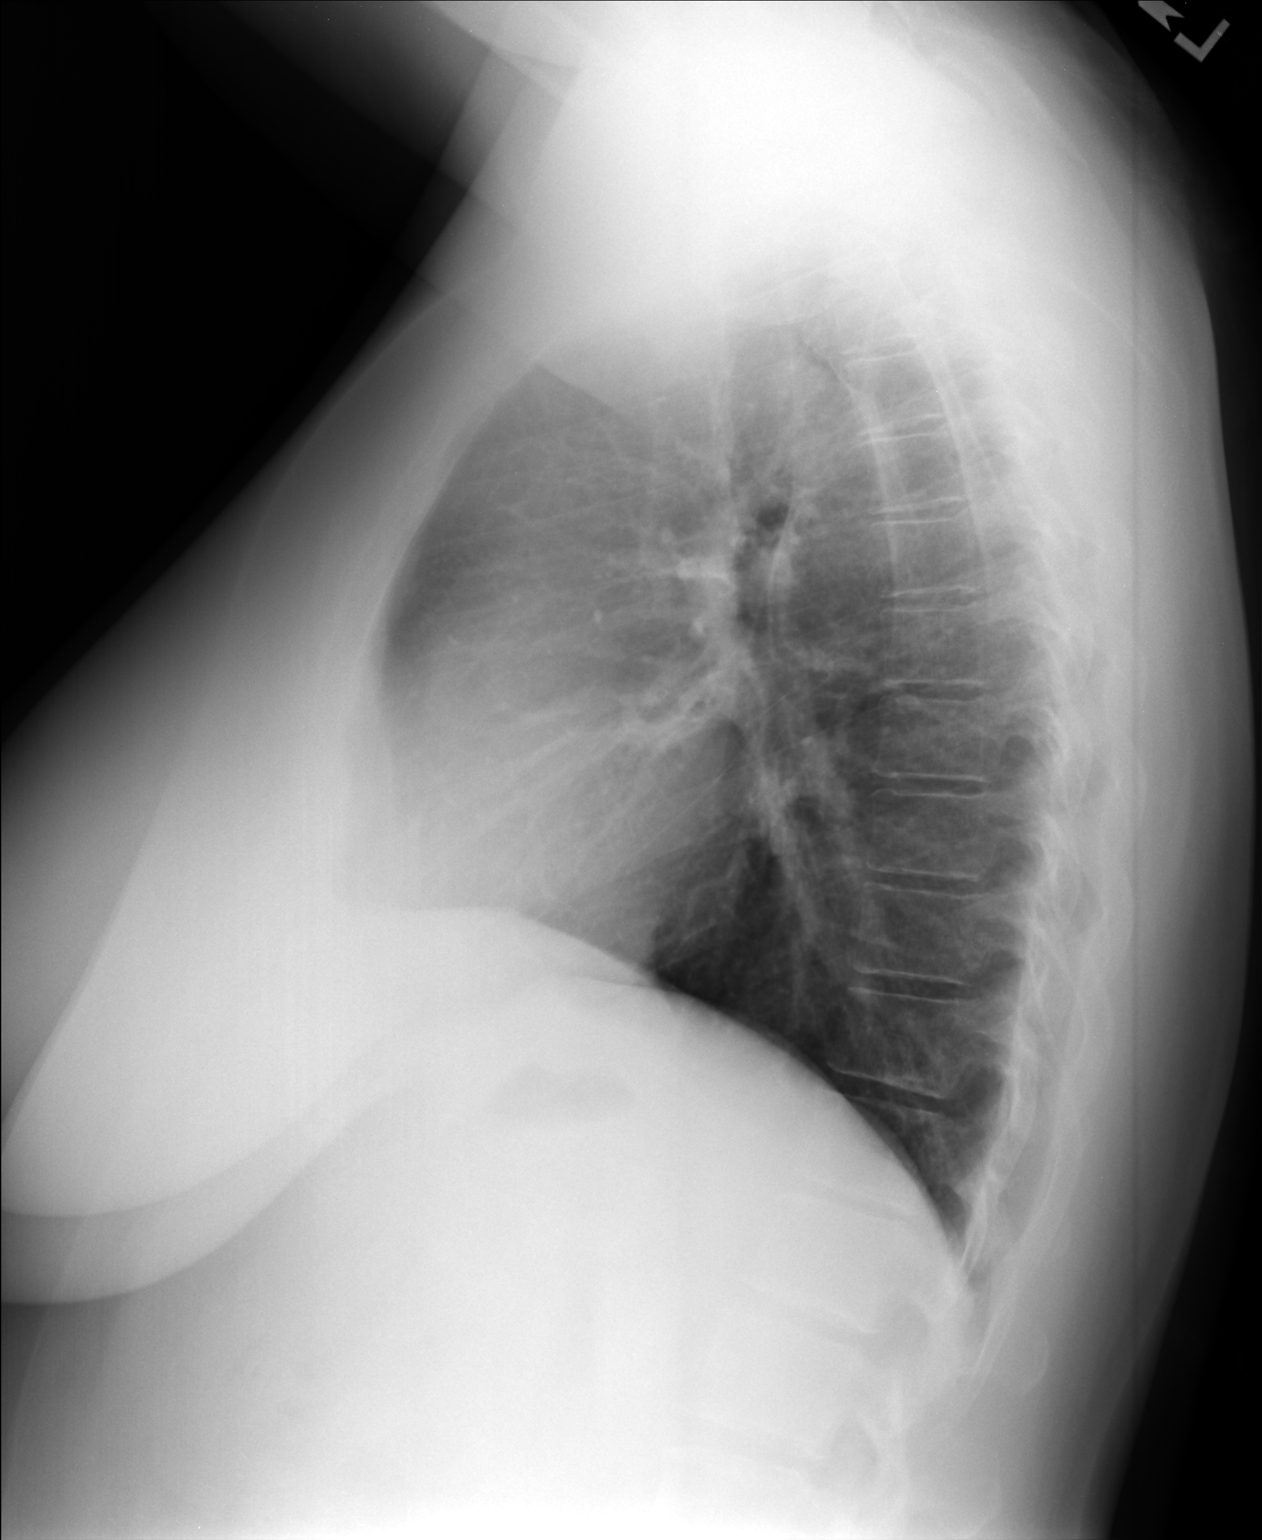

[2 of 2 positions shown; findings below may reference images not displayed]

FINDINGS: The heart size and mediastinal contours are within normal limits.
Both lungs are clear. The visualized skeletal structures are
unremarkable.
IMPRESSION: No active cardiopulmonary disease.

## 2018-09-21 DEATH — deceased
